# Patient Record
Sex: Male | Born: 1960 | Race: White | Hispanic: No | Marital: Married | Smoking: Current every day smoker
Health system: Southern US, Community
[De-identification: ages and names within clinical notes are randomized; demographics above are authoritative.]

## PROBLEM LIST (undated history)

## (undated) DIAGNOSIS — X838XXA Intentional self-harm by other specified means, initial encounter: Secondary | ICD-10-CM

## (undated) DIAGNOSIS — K219 Gastro-esophageal reflux disease without esophagitis: Secondary | ICD-10-CM

## (undated) DIAGNOSIS — E785 Hyperlipidemia, unspecified: Secondary | ICD-10-CM

## (undated) DIAGNOSIS — F329 Major depressive disorder, single episode, unspecified: Secondary | ICD-10-CM

## (undated) DIAGNOSIS — F32A Depression, unspecified: Secondary | ICD-10-CM

## (undated) HISTORY — DX: Hyperlipidemia, unspecified: E78.5

## (undated) HISTORY — PX: TONSILLECTOMY: SUR1361

## (undated) HISTORY — DX: Intentional self-harm by other specified means, initial encounter: X83.8XXA

## (undated) HISTORY — DX: Depression, unspecified: F32.A

## (undated) HISTORY — PX: LUNG SURGERY: SHX703

## (undated) HISTORY — DX: Major depressive disorder, single episode, unspecified: F32.9

## (undated) HISTORY — DX: Gastro-esophageal reflux disease without esophagitis: K21.9

---

## 1991-09-16 DIAGNOSIS — X838XXA Intentional self-harm by other specified means, initial encounter: Secondary | ICD-10-CM

## 1991-09-16 HISTORY — DX: Intentional self-harm by other specified means, initial encounter: X83.8XXA

## 2007-05-06 ENCOUNTER — Inpatient Hospital Stay (HOSPITAL_COMMUNITY): Admission: EM | Admit: 2007-05-06 | Discharge: 2007-05-12 | Payer: Self-pay | Admitting: Emergency Medicine
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
## 2011-01-16 ENCOUNTER — Ambulatory Visit (INDEPENDENT_AMBULATORY_CARE_PROVIDER_SITE_OTHER): Payer: Managed Care, Other (non HMO) | Admitting: Family Medicine

## 2011-01-16 ENCOUNTER — Encounter: Payer: Self-pay | Admitting: Family Medicine

## 2011-01-16 VITALS — BP 110/76 | Ht 70.0 in | Wt 162.0 lb

## 2011-01-16 DIAGNOSIS — F17200 Nicotine dependence, unspecified, uncomplicated: Secondary | ICD-10-CM

## 2011-01-16 DIAGNOSIS — Z13220 Encounter for screening for lipoid disorders: Secondary | ICD-10-CM

## 2011-01-16 DIAGNOSIS — N400 Enlarged prostate without lower urinary tract symptoms: Secondary | ICD-10-CM | POA: Insufficient documentation

## 2011-01-16 DIAGNOSIS — R05 Cough: Secondary | ICD-10-CM

## 2011-01-16 DIAGNOSIS — Z720 Tobacco use: Secondary | ICD-10-CM | POA: Insufficient documentation

## 2011-01-16 DIAGNOSIS — B351 Tinea unguium: Secondary | ICD-10-CM | POA: Insufficient documentation

## 2011-01-16 DIAGNOSIS — Z136 Encounter for screening for cardiovascular disorders: Secondary | ICD-10-CM

## 2011-01-16 DIAGNOSIS — Z0000 Encounter for general adult medical examination without abnormal findings: Secondary | ICD-10-CM

## 2011-01-16 LAB — POCT URINALYSIS DIPSTICK
Protein, UA: NEGATIVE
Spec Grav, UA: 1.015
pH, UA: 6

## 2011-01-16 LAB — HEPATIC FUNCTION PANEL
AST: 22 U/L (ref 0–37)
Albumin: 4.3 g/dL (ref 3.5–5.2)
Alkaline Phosphatase: 72 U/L (ref 39–117)
Bilirubin, Direct: 0.1 mg/dL (ref 0.0–0.3)
Total Protein: 6.7 g/dL (ref 6.0–8.3)

## 2011-01-16 LAB — CBC WITH DIFFERENTIAL/PLATELET
Basophils Relative: 0.5 % (ref 0.0–3.0)
Eosinophils Absolute: 0.4 10*3/uL (ref 0.0–0.7)
Eosinophils Relative: 5 % (ref 0.0–5.0)
Hemoglobin: 15.7 g/dL (ref 13.0–17.0)
Lymphocytes Relative: 33 % (ref 12.0–46.0)
MCHC: 35.2 g/dL (ref 30.0–36.0)
MCV: 95 fl (ref 78.0–100.0)
Monocytes Absolute: 0.7 10*3/uL (ref 0.1–1.0)
Neutro Abs: 3.7 10*3/uL (ref 1.4–7.7)
RBC: 4.71 Mil/uL (ref 4.22–5.81)
WBC: 7.3 10*3/uL (ref 4.5–10.5)

## 2011-01-16 LAB — BASIC METABOLIC PANEL
BUN: 13 mg/dL (ref 6–23)
CO2: 29 mEq/L (ref 19–32)
Chloride: 101 mEq/L (ref 96–112)
Creatinine, Ser: 1 mg/dL (ref 0.4–1.5)
Glucose, Bld: 91 mg/dL (ref 70–99)

## 2011-01-16 MED ORDER — VARENICLINE TARTRATE 0.5 MG X 11 & 1 MG X 42 PO MISC: ORAL | Status: AC

## 2011-01-16 NOTE — Assessment & Plan Note (Signed)
Check lft and pt interested in rx

## 2011-01-16 NOTE — Assessment & Plan Note (Signed)
Check PSA. ?

## 2011-01-16 NOTE — Patient Instructions (Signed)
Smoking Cessation This document explains the best ways for you to quit smoking and new treatments to help. It lists new medicines that can double or triple your chances of quitting and quitting for good. It also considers ways to avoid relapses and concerns you may have about quitting, including weight gain. NICOTINE: A POWERFUL ADDICTION If you have tried to quit smoking, you know how hard it can be. It is hard because nicotine is a very addictive drug. For some people, it can be as addictive as heroin or cocaine. Usually, people make 2 or 3 tries, or more, before finally being able to quit. Each time you try to quit, you can learn about what helps and what hurts. Quitting takes hard work and a lot of effort, but you can quit smoking. QUITTING SMOKING IS ONE OF THE MOST IMPORTANT THINGS YOU WILL EVER DO:  You will live longer, feel better, and live better.   The impact on your body of quitting smoking is felt almost immediately:   Within 20 minutes, blood pressure decreases. Pulse returns to its normal level.   After 8 hours, carbon monoxide levels in the blood return to normal. Oxygen level increases.   After 24 hours, chance of heart attack starts to decrease. Breath, hair, and body stop smelling like smoke.   After 48 hours, damaged nerve endings begin to recover. Sense of taste and smell improve.   After 72 hours, the body is virtually free of nicotine. Bronchial tubes relax and breathing becomes easier.   After 2 to 12 weeks, lungs can hold more air. Exercise becomes easier and circulation improves.   Quitting will lower your chance of having a heart attack, stroke, cancer, or lung disease:   After 1 year, the risk of coronary heart disease is cut in half.   After 5 years, the risk of stroke falls to the same as a nonsmoker.   After 10 years, the risk of lung cancer is cut in half and the risk of other cancers decreases significantly.   After 15 years, the risk of coronary heart  disease drops, usually to the level of a nonsmoker.   If you are pregnant, quitting smoking will improve your chances of having a healthy baby.   The people you live with, especially your children, will be healthier.   You will have extra money to spend on things other than cigarettes.  FIVE KEYS TO QUITTING Studies have shown that these 5 steps will help you quit smoking and quit for good. You have the best chances of quitting if you use them together: 1. Get ready.  2. Get support and encouragement.  3. Learn new skills and behaviors.  4. Get medicine to reduce your nicotine addiction and use it correctly.  5. Be prepared for relapse or difficult situations. Be determined to continue trying to quit, even if you do not succeed at first.  1. GET READY  Set a quit date.   Change your environment.   Get rid of ALL cigarettes, ashtrays, matches, and lighters in your home, car, and place of work.   Do not let people smoke in your home.   Review your past attempts to quit. Think about what worked and what did not.   Once you quit, do not smoke. NOT EVEN A PUFF!  2. GET SUPPORT AND ENCOURAGEMENT Studies have shown that you have a better chance of being successful if you have help. You can get support in many ways.  Tell   your family, friends, and coworkers that you are going to quit and need their support. Ask them not to smoke around you.   Talk to your caregivers (doctor, dentist, nurse, pharmacist, psychologist, and/or smoking counselor).   Get individual, group, or telephone counseling and support. The more counseling you have, the better your chances are of quitting. Programs are available at local hospitals and health centers. Call your local health department for information about programs in your area.   Spiritual beliefs and practices may help some smokers quit.   Quit meters are small computer programs online or downloadable that keep track of quit statistics, such as amount  of "quit-time," cigarettes not smoked, and money saved.   Many smokers find one or more of the many self-help books available useful in helping them quit and stay off tobacco.  3. LEARN NEW SKILLS AND BEHAVIORS  Try to distract yourself from urges to smoke. Talk to someone, go for a walk, or occupy your time with a task.   When you first try to quit, change your routine. Take a different route to work. Drink tea instead of coffee. Eat breakfast in a different place.   Do something to reduce your stress. Take a hot bath, exercise, or read a book.   Plan something enjoyable to do every day. Reward yourself for not smoking.   Explore interactive web-based programs that specialize in helping you quit.  4. GET MEDICINE AND USE IT CORRECTLY Medicines can help you stop smoking and decrease the urge to smoke. Combining medicine with the above behavioral methods and support can quadruple your chances of successfully quitting smoking. The [place] Food and Drug Administration (FDA) has approved 7 medicines to help you quit smoking. These medicines fall into 3 categories.  Nicotine replacement therapy (delivers nicotine to your body without the negative effects and risks of smoking):   Nicotine gum: Available over-the-counter.   Nicotine lozenges: Available over-the-counter.   Nicotine inhaler: Available by prescription.   Nicotine nasal spray: Available by prescription.   Nicotine skin patches (transdermal): Available by prescription and over-the-counter.   Antidepressant medicine (helps people abstain from smoking, but how this works is unknown):   Bupropion sustained-release (SR) tablets: Available by prescription.   Nicotinic receptor partial agonist (simulates the effect of nicotine in your brain):   Varenicline tartrate tablets: Available by prescription.   Ask your caregiver for advice about which medicines to use and how to use them. Carefully read the information on the package.    Everyone who is trying to quit may benefit from using a medicine. If you are pregnant or trying to become pregnant, nursing an infant, you are under age 18, or you smoke fewer than 10 cigarettes per day, talk to your caregiver before taking any nicotine replacement medicines.   You should stop using a nicotine replacement product and call your caregiver if you experience nausea, dizziness, weakness, vomiting, fast or irregular heartbeat, mouth problems with the lozenge or gum, or redness or swelling of the skin around the patch that does not go away.   Do not use any other product containing nicotine while using a nicotine replacement product.   Talk to your caregiver before using these products if you have diabetes, heart disease, asthma, stomach ulcers, you had a recent heart attack, you have high blood pressure that is not controlled with medicine, a history of irregular heartbeat, or you have been prescribed medicine to help you quit smoking.  5. BE PREPARED FOR RELAPSE OR   DIFFICULT SITUATIONS  Most relapses occur within the first 3 months after quitting. Do not be discouraged if you start smoking again. Remember, most people try several times before they finally quit.   You may have symptoms of withdrawal because your body is used to nicotine. You may crave cigarettes, be irritable, feel very hungry, cough often, get headaches, or have difficulty concentrating.   The withdrawal symptoms are only temporary. They are strongest when you first quit, but they will go away within 10 to 14 days.  Here are some difficult situations to watch for:  Alcohol. Avoid drinking alcohol. Drinking lowers your chances of successfully quitting.   Caffeine. Try to reduce the amount of caffeine you consume. It also lowers your chances of successfully quitting.   Other smokers. Being around smoking can make you want to smoke. Avoid smokers.   Weight gain. Many smokers will gain weight when they quit, usually  less than 10 pounds. Eat a healthy diet and stay active. Do not let weight gain distract you from your main goal, quitting smoking. Some medicines that help you quit smoking may also help delay weight gain. You can always lose the weight gained after you quit.   Bad mood or depression. There are a lot of ways to improve your mood other than smoking.  If you are having problems with any of these situations, talk to your caregiver. SPECIAL SITUATIONS OR CONDITIONS Studies suggest that everyone can quit smoking. Your situation or condition can give you a special reason to quit.  Pregnant women/New mothers: By quitting, you protect your baby's health and your own.   Hospitalized patients: By quitting, you reduce health problems and help healing.   Heart attack patients: By quitting, you reduce your risk of a second heart attack.   Lung, head, and neck cancer patients: By quitting, you reduce your chance of a second cancer.   Parents of children and adolescents: By quitting, you protect your children from illnesses caused by secondhand smoke.  QUESTIONS TO THINK ABOUT Think about the following questions before you try to stop smoking. You may want to talk about your answers with your caregiver.  Why do you want to quit?   If you tried to quit in the past, what helped and what did not?   What will be the most difficult situations for you after you quit? How will you plan to handle them?   Who can help you through the tough times? Your family? Friends? Caregiver?   What pleasures do you get from smoking? What ways can you still get pleasure if you quit?  Here are some questions to ask your caregiver:  How can you help me to be successful at quitting?   What medicine do you think would be best for me and how should I take it?   What should I do if I need more help?   What is smoking withdrawal like? How can I get information on withdrawal?  Quitting takes hard work and a lot of effort,  but you can quit smoking. FOR MORE INFORMATION Smokefree.gov (http://www.smokefree.gov) provides free, accurate, evidence-based information and professional assistance to help support the immediate and long-term needs of people trying to quit smoking. Document Released: 08/26/2001 Document Re-Released: 02/19/2010 [employer] Patient Information 2011 [employer]. 

## 2011-01-16 NOTE — Progress Notes (Signed)
  Subjective:    Patient ID: Randy [name], male    DOB: 30-Aug-1961, 50 y.o.   MRN: [id number]  HPI Pt here for cpe and labs.   Pt would like to quit smoking and is interested in chantix.  Pt also wants meds for fungus on his toenails.  He has used otc with no relief.    Review of Systems  Review of Systems  Constitutional: Negative for activity change, appetite change and fatigue.  HENT: Negative for hearing loss, congestion, tinnitus and ear discharge.  dentist --due Eyes: Negative for visual disturbance (see optho q1y -- vision corrected to 20/20 with glasses).  Respiratory: Negative for cough, chest tightness and shortness of breath.   Cardiovascular: Negative for chest pain, palpitations and leg swelling.  Gastrointestinal: Negative for abdominal pain, diarrhea, constipation and abdominal distention.  Genitourinary: Negative for urgency, frequency, decreased urine volume and difficulty urinating.  Musculoskeletal: Negative for back pain, arthralgias and gait problem.  Skin: Negative for color change, pallor and rash.  Neurological: Negative for dizziness, light-headedness, numbness and headaches.  Hematological: Negative for adenopathy. Does not bruise/bleed easily.  Psychiatric/Behavioral: Negative for suicidal ideas, confusion, sleep disturbance, self-injury, dysphoric mood, decreased concentration and agitation.      Objective:   Physical Exam BP 110/76  Ht 5\' 10"  (1.778 m)  Wt 162 lb (73.483 kg)  BMI 23.24 kg/m2  General Appearance:    Alert, cooperative, no distress, appears stated age  Head:    Normocephalic, without obvious abnormality, atraumatic  Eyes:    PERRL, conjunctiva/corneas clear, EOM's intact, fundi    benign, both eyes       Ears:    Normal TM's and external ear canals, both ears  Nose:   Nares normal, septum midline, mucosa normal, no drainage   or sinus tenderness  Throat:   Lips, mucosa, and tongue normal; teeth and gums normal  Neck:   Supple,  symmetrical, trachea midline, no adenopathy;       thyroid:  No enlargement/tenderness/nodules; no carotid   bruit or JVD  Back:     Symmetric, no curvature, ROM normal, no CVA tenderness  Lungs:     Few wheezes scattered, respirations unlabored  Chest wall:    No tenderness or deformity  Heart:    Regular rate and rhythm, S1 and S2 normal, no murmur, rub   or gallop  Abdomen:     Soft, non-tender, bowel sounds active all four quadrants,    no mass   Genitalia:    Normal male without lesion, discharge or tenderness  Rectal:    Normal tone, mildly enlarged prostate, no masses or tenderness;   guaiac negative stool  Extremities:   Extremities normal, atraumatic, no cyanosis or edema--+onchomycosis b/l big toes  Pulses:   2+ and symmetric all extremities  Skin:   Skin color, texture, turgor normal, no rashes or lesions  Lymph nodes:   Cervical, supraclavicular, and axillary nodes normal  Neurologic:   CNII-XII intact. Normal strength, sensation and reflexes      throughout        Assessment & Plan:

## 2011-01-16 NOTE — Assessment & Plan Note (Signed)
chantix

## 2011-01-16 NOTE — Assessment & Plan Note (Signed)
ghm utd Check fasting labs 

## 2011-01-17 LAB — LDL CHOLESTEROL, DIRECT: Direct LDL: 106.5 mg/dL

## 2011-01-21 ENCOUNTER — Encounter: Payer: Self-pay | Admitting: *Deleted

## 2011-01-21 ENCOUNTER — Telehealth: Payer: Self-pay | Admitting: *Deleted

## 2011-01-21 NOTE — Telephone Encounter (Addendum)
Left message to call office   Message copied by Candie Echevaria on Tue Jan 21, 2011  3:41 PM ------      Message from: Loreen Freud      Created: Mon Jan 20, 2011 11:25 PM       + TG are high-----   Cholesterol--- LDL goal < 100,  HDL >40,  TG < 150.  Diet and exercise will increase HDL and decrease LDL and TG.  Fish,  Fish Oil, Flaxseed oil will also help increase the HDL and decrease Triglycerides.   Take tricor 145 mg  #30 1 po qd, 2 refills.    Recheck labs in 3 months.  272.4  Lipid, hep

## 2011-01-22 NOTE — Telephone Encounter (Signed)
Left message to call office

## 2011-01-23 NOTE — Telephone Encounter (Signed)
Left message to call office

## 2011-01-28 MED ORDER — FENOFIBRATE 145 MG PO TABS: 145.0000 mg | ORAL_TABLET | Freq: Every day | ORAL | Status: DC
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
## 2011-01-28 NOTE — Telephone Encounter (Signed)
Pt aware, Rx sent to pharmacy, copy of labs mailed.
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
## 2011-03-17 ENCOUNTER — Telehealth: Payer: Self-pay | Admitting: *Deleted
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
## 2011-03-18 NOTE — Telephone Encounter (Signed)
Left message to call office

## 2011-03-18 NOTE — Telephone Encounter (Signed)
Agree--- can also try aveeno oatmeal bath. ----pt needs to be seen if rash no better

## 2011-03-18 NOTE — Telephone Encounter (Signed)
Pt  c/o itchy, reddish bumps/rash on legs and arms. Pt notes that he notice rash after a fishing trip he took which expose him to the sun for extensive hours. Pt indicated that he has not had any changes in food, laundry detergent, or household product that could contribute to rash. Pt also denies any exposure to any chemical or outside plant.  Pt advise to try benadryl for the rash and that it is not likely that tricor cause the rash since he has been on med for several months with no reaction. Pt ok, and advise if rash increases or worsen in any way he needs to be seen in office. Pt advise of warning sign to watch for.  Pt has tried a hydrocortisone cream which has relieved the itching but the rash is still present. Please advise

## 2011-03-18 NOTE — Telephone Encounter (Signed)
mssg left for patient to return call     KP

## 2011-03-21 NOTE — Telephone Encounter (Signed)
Pt notified and notes that rash is now going away.

## 2011-04-21 ENCOUNTER — Ambulatory Visit: Payer: Managed Care, Other (non HMO) | Admitting: Family Medicine

## 2011-04-25 ENCOUNTER — Encounter: Payer: Self-pay | Admitting: Family Medicine

## 2011-04-25 ENCOUNTER — Ambulatory Visit (INDEPENDENT_AMBULATORY_CARE_PROVIDER_SITE_OTHER): Payer: Managed Care, Other (non HMO) | Admitting: Family Medicine

## 2011-04-25 VITALS — BP 120/80 | HR 66 | Wt 160.0 lb

## 2011-04-25 DIAGNOSIS — E781 Pure hyperglyceridemia: Secondary | ICD-10-CM

## 2011-04-25 LAB — BASIC METABOLIC PANEL
CO2: 27 mEq/L (ref 19–32)
Calcium: 9.5 mg/dL (ref 8.4–10.5)
Chloride: 103 mEq/L (ref 96–112)
Glucose, Bld: 97 mg/dL (ref 70–99)
Sodium: 139 mEq/L (ref 135–145)

## 2011-04-25 LAB — LIPID PANEL
Cholesterol: 173 mg/dL (ref 0–200)
Triglycerides: 230 mg/dL — ABNORMAL HIGH (ref 0.0–149.0)

## 2011-04-25 MED ORDER — FENOFIBRATE MICRONIZED 130 MG PO CAPS: 130.0000 mg | ORAL_CAPSULE | Freq: Every day | ORAL | Status: DC

## 2011-04-25 NOTE — Progress Notes (Signed)
  Subjective:    Randy [name] is a 50 y.o. male here for follow up of dyslipidemia. The patient does not use medications that may worsen dyslipidemias (corticosteroids, progestins, anabolic steroids, diuretics, beta-blockers, amiodarone, cyclosporine, olanzapine). The patient exercises frequently. The patient is not known to have coexisting coronary artery disease.  Pt also thinks he may have had a reaction to tricor---but he was also in sun a lot---rash on hands b/l--itchy  Cardiac Risk Factors Age > 45-male, > 55-male:  YES  +1  Smoking:   NO  Sig. family hx of CHD*:  NO  Hypertension:   NO  Diabetes:   NO  HDL < 35:   YES  +1  HDL > 59:   NO  Total: 2   *- Sig. family h/o CHD per NCEP = MI or sudden death at <55yo in  father or other 1st-degree male relative, or <65yo in mother or  other 1st-degree male relative  The following portions of the patient's history were reviewed and updated as appropriate: allergies, current medications, past family history, past medical history, past social history, past surgical history and problem list.  Review of Systems Pertinent items are noted in HPI.    Objective:    BP 120/80  Pulse 66  Wt 160 lb (72.576 kg) General appearance: alert, cooperative, appears stated age and no distress Lungs: clear to auscultation bilaterally Heart: regular rate and rhythm, S1, S2 normal, no murmur, click, rub or gallop Skin: Skin color, texture, turgor normal. No rashes or lesions or papular - hand(s) bilateral  Lab Review Lab Results  Component Value Date   CHOL 189 01/16/2011   HDL 32.40* 01/16/2011   LDLDIRECT 106.5 01/16/2011      Assessment:    Dyslipidemia as detailed above with 2 CHD risk factors using NCEP scheme above.  Target levels for LDL are: < 100 mg/dl (CHD or "CHD risk equivalent" is present)  Explained to the patient the respective contributions of genetics, diet, and exercise to lipid levels and the use of medication in severe  cases which do not respond to lifestyle alteration. The patient's interest and motivation in making lifestyle changes seems fair.    Plan:    The following changes are planned for the next 3 months, at which time the patient will return for repeat fasting lipids:  1. Dietary changes: Increase soluble fiber Plant sterols 2grams per day (e.g. Benecol): yes Reduce saturated fat, "trans" monounsaturated fatty acids, and cholesterol 2. Exercise changes:  none 3. Other treatment: None 4. Lipid-lowering medications: antara  (Recommended by NCEP after 3-6 mos of dietary therapy & lifestyle modification,  except if CHD is present or LDL well above 190.) 5. Hormone replacement therapy (patient is a postmenopausal  woman): no 6. Screening for secondary causes of dyslipidemias: None indicated 7. Lipid screening for relatives: na 8. Follow up: 3 months. 9. Stop tricor for 2 weeks and see if rash resolves-- call office with results Note: The majority of the visit was spent in counseling on the pathophysiology and treatment of dyslipidemias. The total face-to-face time was in excess of 25 minutes.

## 2011-04-25 NOTE — Patient Instructions (Signed)
Stop tricor for 2 weeks---see if skin clears up Try antara 130 mg daily

## 2011-04-28 ENCOUNTER — Telehealth: Payer: Self-pay | Admitting: *Deleted

## 2011-04-28 NOTE — Telephone Encounter (Signed)
Cholesterol--- LDL goal < 100, HDL >40, TG < 150. Diet and exercise will increase HDL and decrease LDL and TG. Fish, Fish Oil, Flaxseed oil will also help increase the HDL and decrease Triglycerides. Recheck labs in 3 months. ----- how is pt doing with antara? ----lipid, hep 272.4   Left message to call back.

## 2011-05-02 NOTE — Telephone Encounter (Signed)
Left message to call office

## 2011-05-05 NOTE — Telephone Encounter (Signed)
Discussed with patient and he stated he had not started the Antara yet because he was going to the beach, he is back and and has been advised to start meds and if any issues to follow up with Dr.Lowne and he voiced understanding and agreed.       KP

## 2011-06-27 LAB — DIFFERENTIAL
Basophils Absolute: 0
Basophils Relative: 0
Eosinophils Absolute: 0
Eosinophils Relative: 1
Lymphocytes Relative: 21
Lymphs Abs: 2
Monocytes Absolute: 0.6
Monocytes Relative: 6
Neutro Abs: 6.9
Neutrophils Relative %: 72

## 2011-06-27 LAB — CBC
HCT: 40.6
HCT: 43.3
HCT: 46.5
Hemoglobin: 14.2
Hemoglobin: 15.3
Hemoglobin: 16.5
MCHC: 35.5
MCV: 94.1
Platelets: 248
Platelets: 275
RBC: 4.26
RBC: 4.94
RDW: 12.4
RDW: 12.5
RDW: 13
WBC: 11.7 — ABNORMAL HIGH
WBC: 9.6

## 2011-06-27 LAB — COMPREHENSIVE METABOLIC PANEL
Alkaline Phosphatase: 88
BUN: 8
CO2: 28
Chloride: 109
Glucose, Bld: 124 — ABNORMAL HIGH
Potassium: 3.9
Total Bilirubin: 0.5

## 2011-06-27 LAB — I-STAT 8, (EC8 V) (CONVERTED LAB)
BUN: 8
Bicarbonate: 27 — ABNORMAL HIGH
Chloride: 110
Glucose, Bld: 97
HCT: 49
Hemoglobin: 16.7
Operator id: 279831
Potassium: 4.2
Sodium: 144
TCO2: 29
pCO2, Ven: 52.6 — ABNORMAL HIGH
pH, Ven: 7.318 — ABNORMAL HIGH

## 2011-06-27 LAB — ETHANOL: Alcohol, Ethyl (B): 323 — ABNORMAL HIGH

## 2011-06-27 LAB — COMPREHENSIVE METABOLIC PANEL WITH GFR
ALT: 31
AST: 38 — ABNORMAL HIGH
Albumin: 4.4
Calcium: 8.4
Creatinine, Ser: 0.9
GFR calc Af Amer: >60
GFR calc non Af Amer: >60
Sodium: 144
Total Protein: 6.7

## 2011-06-27 LAB — PROTIME-INR
INR: 1
Prothrombin Time: 12.9

## 2011-06-27 LAB — APTT: aPTT: 33

## 2011-06-27 LAB — TYPE AND SCREEN
ABO/RH(D): O POS
Antibody Screen: NEGATIVE

## 2011-06-27 LAB — POCT I-STAT CREATININE
Creatinine, Ser: 0.9
Operator id: 279831

## 2011-06-27 LAB — ABO/RH: ABO/RH(D): O POS

## 2011-10-10 ENCOUNTER — Other Ambulatory Visit: Payer: Self-pay | Admitting: Family Medicine

## 2011-10-10 DIAGNOSIS — E781 Pure hyperglyceridemia: Secondary | ICD-10-CM

## 2011-10-10 MED ORDER — FENOFIBRATE MICRONIZED 130 MG PO CAPS: 130.0000 mg | ORAL_CAPSULE | Freq: Every day | ORAL | Status: DC

## 2011-10-10 NOTE — Telephone Encounter (Signed)
Letter mailed to schedule labs.      KP 

## 2011-10-20 ENCOUNTER — Other Ambulatory Visit: Payer: Self-pay

## 2011-10-20 DIAGNOSIS — E781 Pure hyperglyceridemia: Secondary | ICD-10-CM

## 2011-10-20 MED ORDER — FENOFIBRATE MICRONIZED 130 MG PO CAPS: 130.0000 mg | ORAL_CAPSULE | Freq: Every day | ORAL | Status: DC

## 2011-12-02 ENCOUNTER — Telehealth: Payer: Self-pay | Admitting: Family Medicine

## 2011-12-02 NOTE — Telephone Encounter (Signed)
Patient is coming in for labs 12/08/11 @ 110am, can you please put lab orders (got notice with refill in 10/2011)  in  Thanks

## 2011-12-02 NOTE — Telephone Encounter (Signed)
----  lipid, hep 272.4-- CPE is almost due, please schedule since apt are going fast     KP

## 2011-12-03 NOTE — Telephone Encounter (Signed)
Called patient back to advise approval of lab orders & that he is due for a Physical. Last Physical was 5.3.12 & our physicals go quick so please call me back to make an appointment for a CPE.

## 2011-12-08 ENCOUNTER — Other Ambulatory Visit (INDEPENDENT_AMBULATORY_CARE_PROVIDER_SITE_OTHER): Payer: Managed Care, Other (non HMO)

## 2011-12-08 ENCOUNTER — Other Ambulatory Visit: Payer: Self-pay

## 2011-12-08 DIAGNOSIS — E781 Pure hyperglyceridemia: Secondary | ICD-10-CM

## 2011-12-08 DIAGNOSIS — E7210 Disorders of sulfur-bearing amino-acid metabolism, unspecified: Secondary | ICD-10-CM

## 2011-12-08 LAB — LIPID PANEL
HDL: 35.9 mg/dL — ABNORMAL LOW (ref >39.00)
LDL Cholesterol: 80 mg/dL (ref 0–99)
Total CHOL/HDL Ratio: 4
Triglycerides: 192 mg/dL — ABNORMAL HIGH (ref 0.0–149.0)

## 2011-12-08 LAB — HEPATIC FUNCTION PANEL
Albumin: 4.4 g/dL (ref 3.5–5.2)
Total Bilirubin: 0.2 mg/dL — ABNORMAL LOW (ref 0.3–1.2)

## 2011-12-08 MED ORDER — ANTARA 130 MG PO CAPS: 130.0000 mg | ORAL_CAPSULE | Freq: Every day | ORAL | Status: DC

## 2012-02-02 ENCOUNTER — Ambulatory Visit (INDEPENDENT_AMBULATORY_CARE_PROVIDER_SITE_OTHER): Payer: Managed Care, Other (non HMO) | Admitting: Family Medicine

## 2012-02-02 ENCOUNTER — Encounter: Payer: Self-pay | Admitting: Family Medicine

## 2012-02-02 VITALS — BP 118/78 | HR 69 | Temp 98.3°F | Ht 70.0 in | Wt 164.0 lb

## 2012-02-02 DIAGNOSIS — E781 Pure hyperglyceridemia: Secondary | ICD-10-CM

## 2012-02-02 DIAGNOSIS — Z720 Tobacco use: Secondary | ICD-10-CM

## 2012-02-02 DIAGNOSIS — F17200 Nicotine dependence, unspecified, uncomplicated: Secondary | ICD-10-CM

## 2012-02-02 DIAGNOSIS — Z23 Encounter for immunization: Secondary | ICD-10-CM

## 2012-02-02 DIAGNOSIS — Z0000 Encounter for general adult medical examination without abnormal findings: Secondary | ICD-10-CM

## 2012-02-02 LAB — POCT URINALYSIS DIPSTICK
Blood, UA: NEGATIVE
Glucose, UA: NEGATIVE
Ketones, UA: NEGATIVE
Protein, UA: NEGATIVE
Spec Grav, UA: 1.01

## 2012-02-02 NOTE — Progress Notes (Signed)
Addended by: Arnette Norris on: 02/02/2012 02:23 PM   Modules accepted: Orders

## 2012-02-02 NOTE — Patient Instructions (Signed)
Preventive Care for Adults, Male A healthy lifestyle and preventative care can promote health and wellness. Preventative health guidelines for men include the following key practices:  A routine yearly physical is a good way to check with your caregiver about your health and preventative screening. It is a chance to share any concerns and updates on your health, and to receive a thorough exam.   Visit your dentist for a routine exam and preventative care every 6 months. Brush your teeth twice a day and floss once a day. Good oral hygiene prevents tooth decay and gum disease.   The frequency of eye exams is based on your age, health, family medical history, use of contact lenses, and other factors. Follow your caregiver's recommendations for frequency of eye exams.   Eat a healthy diet. Foods like vegetables, fruits, whole grains, low-fat dairy products, and lean protein foods contain the nutrients you need without too many calories. Decrease your intake of foods high in solid fats, added sugars, and salt. Eat the right amount of calories for you.Get information about a proper diet from your caregiver, if necessary.   Regular physical exercise is one of the most important things you can do for your health. Most adults should get at least 150 minutes of moderate-intensity exercise (any activity that increases your heart rate and causes you to sweat) each week. In addition, most adults need muscle-strengthening exercises on 2 or more days a week.   Maintain a healthy weight. The body mass index (BMI) is a screening tool to identify possible weight problems. It provides an estimate of body fat based on height and weight. Your caregiver can help determine your BMI, and can help you achieve or maintain a healthy weight.For adults 20 years and older:   A BMI below 18.5 is considered underweight.   A BMI of 18.5 to 24.9 is normal.   A BMI of 25 to 29.9 is considered overweight.   A BMI of 30 and above  is considered obese.   Maintain normal blood lipids and cholesterol levels by exercising and minimizing your intake of saturated fat. Eat a balanced diet with plenty of fruit and vegetables. Blood tests for lipids and cholesterol should begin at age 20 and be repeated every 5 years. If your lipid or cholesterol levels are high, you are over 50, or you are a high risk for heart disease, you may need your cholesterol levels checked more frequently.Ongoing high lipid and cholesterol levels should be treated with medicines if diet and exercise are not effective.   If you smoke, find out from your caregiver how to quit. If you do not use tobacco, do not start.   If you choose to drink alcohol, do not exceed 2 drinks per day. One drink is considered to be 12 ounces (355 mL) of beer, 5 ounces (148 mL) of wine, or 1.5 ounces (44 mL) of liquor.   Avoid use of street drugs. Do not share needles with anyone. Ask for help if you need support or instructions about stopping the use of drugs.   High blood pressure causes heart disease and increases the risk of stroke. Your blood pressure should be checked at least every 1 to 2 years. Ongoing high blood pressure should be treated with medicines, if weight loss and exercise are not effective.   If you are 45 to 50 years old, ask your caregiver if you should take aspirin to prevent heart disease.   Diabetes screening involves taking a blood   sample to check your fasting blood sugar level. This should be done once every 3 years, after age 45, if you are within normal weight and without risk factors for diabetes. Testing should be considered at a younger age or be carried out more frequently if you are overweight and have at least 1 risk factor for diabetes.   Colorectal cancer can be detected and often prevented. Most routine colorectal cancer screening begins at the age of 50 and continues through age 75. However, your caregiver may recommend screening at an earlier  age if you have risk factors for colon cancer. On a yearly basis, your caregiver may provide home test kits to check for hidden blood in the stool. Use of a small camera at the end of a tube, to directly examine the colon (sigmoidoscopy or colonoscopy), can detect the earliest forms of colorectal cancer. Talk to your caregiver about this at age 50, when routine screening begins. Direct examination of the colon should be repeated every 5 to 10 years through age 75, unless early forms of pre-cancerous polyps or small growths are found.   Hepatitis C blood testing is recommended for all people born from 1945 through 1965 and any individual with known risks for hepatitis C.   Practice safe sex. Use condoms and avoid high-risk sexual practices to reduce the spread of sexually transmitted infections (STIs). STIs include gonorrhea, chlamydia, syphilis, trichomonas, herpes, HPV, and human immunodeficiency virus (HIV). Herpes, HIV, and HPV are viral illnesses that have no cure. They can result in disability, cancer, and death.   A one-time screening for abdominal aortic aneurysm (AAA) and surgical repair of large AAAs by sound wave imaging (ultrasonography) is recommended for ages 65 to 75 years who are current or former smokers.   Healthy men should no longer receive prostate-specific antigen (PSA) blood tests as part of routine cancer screening. Consult with your caregiver about prostate cancer screening.   Testicular cancer screening is not recommended for adult males who have no symptoms. Screening includes self-exam, caregiver exam, and other screening tests. Consult with your caregiver about any symptoms you have or any concerns you have about testicular cancer.   Use sunscreen with skin protection factor (SPF) of 30 or more. Apply sunscreen liberally and repeatedly throughout the day. You should seek shade when your shadow is shorter than you. Protect yourself by wearing long sleeves, pants, a  wide-brimmed hat, and sunglasses year round, whenever you are outdoors.   Once a month, do a whole body skin exam, using a mirror to look at the skin on your back. Notify your caregiver of new moles, moles that have irregular borders, moles that are larger than a pencil eraser, or moles that have changed in shape or color.   Stay current with required immunizations.   Influenza. You need a dose every fall (or winter). The composition of the flu vaccine changes each year, so being vaccinated once is not enough.   Pneumococcal polysaccharide. You need 1 to 2 doses if you smoke cigarettes or if you have certain chronic medical conditions. You need 1 dose at age 65 (or older) if you have never been vaccinated.   Tetanus, diphtheria, pertussis (Tdap, Td). Get 1 dose of Tdap vaccine if you are younger than age 65 years, are over 65 and have contact with an infant, are a healthcare worker, or simply want to be protected from whooping cough. After that, you need a Td booster dose every 10 years. Consult your caregiver if   you have not had at least 3 tetanus and diphtheria-containing shots sometime in your life or have a deep or dirty wound.   HPV. This vaccine is recommended for males 13 through 50 years of age. This vaccine may be given to men 22 through 50 years of age who have not completed the 3 dose series. It is recommended for men through age 26 who have sex with men or whose immune system is weakened because of HIV infection, other illness, or medications. The vaccine is given in 3 doses over 6 months.   Measles, mumps, rubella (MMR). You need at least 1 dose of MMR if you were born in 1957 or later. You may also need a 2nd dose.   Meningococcal. If you are age 19 to 21 years and a first-year college student living in a residence hall, or have one of several medical conditions, you need to get vaccinated against meningococcal disease. You may also need additional booster doses.   Zoster (shingles).  If you are age 60 years or older, you should get this vaccine.   Varicella (chickenpox). If you have never had chickenpox or you were vaccinated but received only 1 dose, talk to your caregiver to find out if you need this vaccine.   Hepatitis A. You need this vaccine if you have a specific risk factor for hepatitis A virus infection, or you simply wish to be protected from this disease. The vaccine is usually given as 2 doses, 6 to 18 months apart.   Hepatitis B. You need this vaccine if you have a specific risk factor for hepatitis B virus infection or you simply wish to be protected from this disease. The vaccine is given in 3 doses, usually over 6 months.  Preventative Service / Frequency Ages 19 to 39  Blood pressure check.** / Every 1 to 2 years.   Lipid and cholesterol check.** / Every 5 years beginning at age 20.   Hepatitis C blood test.** / For any individual with known risks for hepatitis C.   Skin self-exam. / Monthly.   Influenza immunization.** / Every year.   Pneumococcal polysaccharide immunization.** / 1 to 2 doses if you smoke cigarettes or if you have certain chronic medical conditions.   Tetanus, diphtheria, pertussis (Tdap,Td) immunization. / A one-time dose of Tdap vaccine. After that, you need a Td booster dose every 10 years.   HPV immunization. / 3 doses over 6 months, if 26 and younger.   Measles, mumps, rubella (MMR) immunization. / You need at least 1 dose of MMR if you were born in 1957 or later. You may also need a 2nd dose.   Meningococcal immunization. / 1 dose if you are age 19 to 21 years and a first-year college student living in a residence hall, or have one of several medical conditions, you need to get vaccinated against meningococcal disease. You may also need additional booster doses.   Varicella immunization.** / Consult your caregiver.   Hepatitis A immunization.** / Consult your caregiver. 2 doses, 6 to 18 months apart.   Hepatitis B  immunization.** / Consult your caregiver. 3 doses usually over 6 months.  Ages 40 to 64  Blood pressure check.** / Every 1 to 2 years.   Lipid and cholesterol check.** / Every 5 years beginning at age 20.   Fecal occult blood test (FOBT) of stool. / Every year beginning at age 50 and continuing until age 75. You may not have to do this test if   you get colonoscopy every 10 years.   Flexible sigmoidoscopy** or colonoscopy.** / Every 5 years for a flexible sigmoidoscopy or every 10 years for a colonoscopy beginning at age 50 and continuing until age 75.   Hepatitis C blood test.** / For all people born from 1945 through 1965 and any individual with known risks for hepatitis C.   Skin self-exam. / Monthly.   Influenza immunization.** / Every year.   Pneumococcal polysaccharide immunization.** / 1 to 2 doses if you smoke cigarettes or if you have certain chronic medical conditions.   Tetanus, diphtheria, pertussis (Tdap/Td) immunization.** / A one-time dose of Tdap vaccine. After that, you need a Td booster dose every 10 years.   Measles, mumps, rubella (MMR) immunization. / You need at least 1 dose of MMR if you were born in 1957 or later. You may also need a 2nd dose.   Varicella immunization.**/ Consult your caregiver.   Meningococcal immunization.** / Consult your caregiver.   Hepatitis A immunization.** / Consult your caregiver. 2 doses, 6 to 18 months apart.   Hepatitis B immunization.** / Consult your caregiver. 3 doses, usually over 6 months.  Ages 65 and over  Blood pressure check.** / Every 1 to 2 years.   Lipid and cholesterol check.**/ Every 5 years beginning at age 20.   Fecal occult blood test (FOBT) of stool. / Every year beginning at age 50 and continuing until age 75. You may not have to do this test if you get colonoscopy every 10 years.   Flexible sigmoidoscopy** or colonoscopy.** / Every 5 years for a flexible sigmoidoscopy or every 10 years for a colonoscopy  beginning at age 50 and continuing until age 75.   Hepatitis C blood test.** / For all people born from 1945 through 1965 and any individual with known risks for hepatitis C.   Abdominal aortic aneurysm (AAA) screening.** / A one-time screening for ages 65 to 75 years who are current or former smokers.   Skin self-exam. / Monthly.   Influenza immunization.** / Every year.   Pneumococcal polysaccharide immunization.** / 1 dose at age 65 (or older) if you have never been vaccinated.   Tetanus, diphtheria, pertussis (Tdap, Td) immunization. / A one-time dose of Tdap vaccine if you are over 65 and have contact with an infant, are a healthcare worker, or simply want to be protected from whooping cough. After that, you need a Td booster dose every 10 years.   Varicella immunization. ** / Consult your caregiver.   Meningococcal immunization.** / Consult your caregiver.   Hepatitis A immunization. ** / Consult your caregiver. 2 doses, 6 to 18 months apart.   Hepatitis B immunization.** / Check with your caregiver. 3 doses, usually over 6 months.  **Family history and personal history of risk and conditions may change your caregiver's recommendations. Document Released: 10/28/2001 Document Revised: 08/21/2011 Document Reviewed: 01/27/2011 [employer] Patient Information 2012 [employer]. 
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
## 2012-02-02 NOTE — Assessment & Plan Note (Signed)
Nicotrol inhaler sample given to pt

## 2012-02-02 NOTE — Assessment & Plan Note (Signed)
Check labs  ghm utd   

## 2012-02-03 LAB — LIPID PANEL
Cholesterol: 182 mg/dL (ref 0–200)
HDL: 29.7 mg/dL — ABNORMAL LOW (ref >39.00)

## 2012-02-03 LAB — HEPATIC FUNCTION PANEL
Albumin: 4.4 g/dL (ref 3.5–5.2)
Alkaline Phosphatase: 58 U/L (ref 39–117)
Total Protein: 6.6 g/dL (ref 6.0–8.3)

## 2012-02-03 LAB — BASIC METABOLIC PANEL
CO2: 24 mEq/L (ref 19–32)
Chloride: 106 mEq/L (ref 96–112)
Glucose, Bld: 93 mg/dL (ref 70–99)
Potassium: 4.5 mEq/L (ref 3.5–5.1)
Sodium: 139 mEq/L (ref 135–145)

## 2012-02-03 LAB — CBC WITH DIFFERENTIAL/PLATELET
Basophils Absolute: 0 10*3/uL (ref 0.0–0.1)
Basophils Relative: 0.8 % (ref 0.0–3.0)
Eosinophils Absolute: 0.2 10*3/uL (ref 0.0–0.7)
HCT: 43.9 % (ref 39.0–52.0)
Hemoglobin: 14.7 g/dL (ref 13.0–17.0)
Lymphocytes Relative: 58.2 % — ABNORMAL HIGH (ref 12.0–46.0)
Lymphs Abs: 2.4 10*3/uL (ref 0.7–4.0)
MCHC: 33.5 g/dL (ref 30.0–36.0)
MCV: 92 fl (ref 78.0–100.0)
Neutro Abs: 1 10*3/uL — ABNORMAL LOW (ref 1.4–7.7)
RBC: 4.77 Mil/uL (ref 4.22–5.81)
RDW: 13 % (ref 11.5–14.6)

## 2012-02-03 LAB — TSH: TSH: 1.03 u[IU]/mL (ref 0.35–5.50)

## 2012-02-03 LAB — PSA: PSA: 0.3 ng/mL (ref 0.10–4.00)

## 2012-02-12 ENCOUNTER — Encounter: Payer: Self-pay | Admitting: *Deleted

## 2012-02-12 ENCOUNTER — Other Ambulatory Visit: Payer: Self-pay | Admitting: *Deleted

## 2012-02-12 MED ORDER — ATORVASTATIN CALCIUM 10 MG PO TABS: 10.0000 mg | ORAL_TABLET | Freq: Every day | ORAL | Status: DC

## 2012-03-23 ENCOUNTER — Other Ambulatory Visit: Payer: Self-pay | Admitting: Family Medicine

## 2012-03-23 DIAGNOSIS — E785 Hyperlipidemia, unspecified: Secondary | ICD-10-CM

## 2012-03-23 DIAGNOSIS — D7289 Other specified disorders of white blood cells: Secondary | ICD-10-CM

## 2012-03-23 NOTE — Telephone Encounter (Signed)
lab orders neededRecheck labs in 3 months. cbcd also abnormal---recheck in 3 months as well ----cbcd 288.8

## 2012-05-03 ENCOUNTER — Other Ambulatory Visit: Payer: Self-pay | Admitting: Family Medicine

## 2012-05-03 DIAGNOSIS — E781 Pure hyperglyceridemia: Secondary | ICD-10-CM

## 2012-05-03 MED ORDER — ANTARA 130 MG PO CAPS: ORAL_CAPSULE | ORAL | Status: DC

## 2012-05-03 NOTE — Telephone Encounter (Signed)
FENOFIBRATE 130 MG CAPSULE QTY:30 LAST REFILL: 03/23/12 TAKE ONE CAPSULE BY MOUTH EVERYDAY BEFORE BREAKFAST

## 2012-05-04 ENCOUNTER — Other Ambulatory Visit (INDEPENDENT_AMBULATORY_CARE_PROVIDER_SITE_OTHER): Payer: Managed Care, Other (non HMO)

## 2012-05-04 DIAGNOSIS — E785 Hyperlipidemia, unspecified: Secondary | ICD-10-CM

## 2012-05-04 DIAGNOSIS — D7289 Other specified disorders of white blood cells: Secondary | ICD-10-CM

## 2012-05-04 LAB — CBC WITH DIFFERENTIAL/PLATELET
Basophils Absolute: 0 10*3/uL (ref 0.0–0.1)
Lymphocytes Relative: 40.7 % (ref 12.0–46.0)
Monocytes Relative: 7.6 % (ref 3.0–12.0)
Neutrophils Relative %: 48 % (ref 43.0–77.0)
Platelets: 287 10*3/uL (ref 150.0–400.0)
RDW: 13.2 % (ref 11.5–14.6)

## 2012-05-04 LAB — LIPID PANEL
Cholesterol: 122 mg/dL (ref 0–200)
HDL: 38.6 mg/dL — ABNORMAL LOW (ref >39.00)
Triglycerides: 133 mg/dL (ref 0.0–149.0)

## 2012-05-04 LAB — HEPATIC FUNCTION PANEL
AST: 23 U/L (ref 0–37)
Albumin: 4.5 g/dL (ref 3.5–5.2)
Alkaline Phosphatase: 55 U/L (ref 39–117)
Total Protein: 6.9 g/dL (ref 6.0–8.3)

## 2012-05-13 ENCOUNTER — Other Ambulatory Visit: Payer: Self-pay | Admitting: Family Medicine

## 2012-05-13 DIAGNOSIS — E781 Pure hyperglyceridemia: Secondary | ICD-10-CM

## 2012-05-13 MED ORDER — ATORVASTATIN CALCIUM 10 MG PO TABS: 10.0000 mg | ORAL_TABLET | Freq: Every day | ORAL | Status: DC

## 2012-05-13 MED ORDER — ANTARA 130 MG PO CAPS: ORAL_CAPSULE | ORAL | Status: DC

## 2012-05-13 NOTE — Addendum Note (Signed)
Addended by: Arnette Norris on: 05/13/2012 11:29 AM   Modules accepted: Orders

## 2012-05-13 NOTE — Telephone Encounter (Signed)
Refills x 2 last ov 5.20.13 V70  1-Atorvastatin (Tab) 10 MG Take 1 tablet (10 mg total) by mouth daily.#30 wt/2-refills Last fill 8.10.13  2-ANTARA 130 MG 1 tab by mouth daily--repeat labs are due now NOTE labs drawn 8.2013 Last fill 8.19.13

## 2012-12-10 ENCOUNTER — Other Ambulatory Visit: Payer: Self-pay | Admitting: Family Medicine

## 2012-12-13 ENCOUNTER — Telehealth: Payer: Self-pay | Admitting: Family Medicine

## 2012-12-13 MED ORDER — ATORVASTATIN CALCIUM 10 MG PO TABS: 10.0000 mg | ORAL_TABLET | Freq: Every day | ORAL | Status: DC

## 2012-12-13 NOTE — Telephone Encounter (Signed)
Refill: atorvastatin 10 mg tablet. Take 1 tablet by mouth every day. Qty 30. Last fill 11-07-12

## 2013-01-18 ENCOUNTER — Other Ambulatory Visit: Payer: Self-pay | Admitting: Family Medicine

## 2013-02-14 ENCOUNTER — Ambulatory Visit (INDEPENDENT_AMBULATORY_CARE_PROVIDER_SITE_OTHER): Payer: BC Managed Care – PPO | Admitting: Family Medicine

## 2013-02-14 ENCOUNTER — Encounter: Payer: Self-pay | Admitting: Family Medicine

## 2013-02-14 VITALS — BP 112/68 | HR 70 | Temp 98.3°F | Ht 70.0 in | Wt 170.8 lb

## 2013-02-14 DIAGNOSIS — Z0000 Encounter for general adult medical examination without abnormal findings: Secondary | ICD-10-CM

## 2013-02-14 DIAGNOSIS — B351 Tinea unguium: Secondary | ICD-10-CM

## 2013-02-14 DIAGNOSIS — N400 Enlarged prostate without lower urinary tract symptoms: Secondary | ICD-10-CM

## 2013-02-14 DIAGNOSIS — Z1211 Encounter for screening for malignant neoplasm of colon: Secondary | ICD-10-CM

## 2013-02-14 DIAGNOSIS — E785 Hyperlipidemia, unspecified: Secondary | ICD-10-CM

## 2013-02-14 LAB — POC HEMOCCULT BLD/STL (OFFICE/1-CARD/DIAGNOSTIC): Fecal Occult Blood, POC: NEGATIVE

## 2013-02-14 MED ORDER — FENOFIBRATE MICRONIZED 130 MG PO CAPS: ORAL_CAPSULE | ORAL | Status: DC

## 2013-02-14 MED ORDER — ATORVASTATIN CALCIUM 10 MG PO TABS: ORAL_TABLET | ORAL | Status: DC

## 2013-02-14 MED ORDER — EPINEPHRINE 0.3 MG/0.3ML IJ SOAJ: 0.3000 mg | Freq: Once | INTRAMUSCULAR | Status: DC
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
## 2013-02-14 NOTE — Patient Instructions (Addendum)
Preventive Care for Adults, Male  A healthy lifestyle and preventive care can promote health and wellness. Preventive health guidelines for men include the following key practices:  · A routine yearly physical is a good way to check with your caregiver about your health and preventative screening. It is a chance to share any concerns and updates on your health, and to receive a thorough exam.  · Visit your dentist for a routine exam and preventative care every 6 months. Brush your teeth twice a day and floss once a day. Good oral hygiene prevents tooth decay and gum disease.  · The frequency of eye exams is based on your age, health, family medical history, use of contact lenses, and other factors. Follow your caregiver's recommendations for frequency of eye exams.  · Eat a healthy diet. Foods like vegetables, fruits, whole grains, low-fat dairy products, and lean protein foods contain the nutrients you need without too many calories. Decrease your intake of foods high in solid fats, added sugars, and salt. Eat the right amount of calories for you. Get information about a proper diet from your caregiver, if necessary.  · Regular physical exercise is one of the most important things you can do for your health. Most adults should get at least 150 minutes of moderate-intensity exercise (any activity that increases your heart rate and causes you to sweat) each week. In addition, most adults need muscle-strengthening exercises on 2 or more days a week.  · Maintain a healthy weight. The body mass index (BMI) is a screening tool to identify possible weight problems. It provides an estimate of body fat based on height and weight. Your caregiver can help determine your BMI, and can help you achieve or maintain a healthy weight. For adults 20 years and older:  · A BMI below 18.5 is considered underweight.  · A BMI of 18.5 to 24.9 is normal.  · A BMI of 25 to 29.9 is considered overweight.  · A BMI of 30 and above is  considered obese.  · Maintain normal blood lipids and cholesterol levels by exercising and minimizing your intake of saturated fat. Eat a balanced diet with plenty of fruit and vegetables. Blood tests for lipids and cholesterol should begin at age 20 and be repeated every 5 years. If your lipid or cholesterol levels are high, you are over 50, or you are a high risk for heart disease, you may need your cholesterol levels checked more frequently. Ongoing high lipid and cholesterol levels should be treated with medicines if diet and exercise are not effective.  · If you smoke, find out from your caregiver how to quit. If you do not use tobacco, do not start.  · If you choose to drink alcohol, do not exceed 2 drinks per day. One drink is considered to be 12 ounces (355 mL) of beer, 5 ounces (148 mL) of wine, or 1.5 ounces (44 mL) of liquor.  · Avoid use of street drugs. Do not share needles with anyone. Ask for help if you need support or instructions about stopping the use of drugs.  · High blood pressure causes heart disease and increases the risk of stroke. Your blood pressure should be checked at least every 1 to 2 years. Ongoing high blood pressure should be treated with medicines, if weight loss and exercise are not effective.  · If you are 45 to 51 years old, ask your caregiver if you should take aspirin to prevent heart disease.  · Diabetes screening involves taking   a blood sample to check your fasting blood sugar level. This should be done once every 3 years, after age 45, if you are within normal weight and without risk factors for diabetes. Testing should be considered at a younger age or be carried out more frequently if you are overweight and have at least 1 risk factor for diabetes.  · Colorectal cancer can be detected and often prevented. Most routine colorectal cancer screening begins at the age of 50 and continues through age 75. However, your caregiver may recommend screening at an earlier age if you  have risk factors for colon cancer. On a yearly basis, your caregiver may provide home test kits to check for hidden blood in the stool. Use of a small camera at the end of a tube, to directly examine the colon (sigmoidoscopy or colonoscopy), can detect the earliest forms of colorectal cancer. Talk to your caregiver about this at age 50, when routine screening begins.  Direct examination of the colon should be repeated every 5 to 10 years through age 75, unless early forms of pre-cancerous polyps or small growths are found.  · Hepatitis C blood testing is recommended for all people born from 1945 through 1965 and any individual with known risks for hepatitis C.  · Practice safe sex. Use condoms and avoid high-risk sexual practices to reduce the spread of sexually transmitted infections (STIs). STIs include gonorrhea, chlamydia, syphilis, trichomonas, herpes, HPV, and human immunodeficiency virus (HIV). Herpes, HIV, and HPV are viral illnesses that have no cure. They can result in disability, cancer, and death.  · A one-time screening for abdominal aortic aneurysm (AAA) and surgical repair of large AAAs by sound wave imaging (ultrasonography) is recommended for ages 65 to 75 years who are current or former smokers.  · Healthy men should no longer receive prostate-specific antigen (PSA) blood tests as part of routine cancer screening. Consult with your caregiver about prostate cancer screening.  · Testicular cancer screening is not recommended for adult males who have no symptoms. Screening includes self-exam, caregiver exam, and other screening tests. Consult with your caregiver about any symptoms you have or any concerns you have about testicular cancer.  · Use sunscreen with skin protection factor (SPF) of 30 or more. Apply sunscreen liberally and repeatedly throughout the day. You should seek shade when your shadow is shorter than you. Protect yourself by wearing long sleeves, pants, a wide-brimmed hat, and  sunglasses year round, whenever you are outdoors.  · Once a month, do a whole body skin exam, using a mirror to look at the skin on your back. Notify your caregiver of new moles, moles that have irregular borders, moles that are larger than a pencil eraser, or moles that have changed in shape or color.  · Stay current with required immunizations.  · Influenza. You need a dose every fall (or winter). The composition of the flu vaccine changes each year, so being vaccinated once is not enough.  · Pneumococcal polysaccharide. You need 1 to 2 doses if you smoke cigarettes or if you have certain chronic medical conditions. You need 1 dose at age 65 (or older) if you have never been vaccinated.  · Tetanus, diphtheria, pertussis (Tdap, Td). Get 1 dose of Tdap vaccine if you are younger than age 65 years, are over 65 and have contact with an infant, are a healthcare worker, or simply want to be protected from whooping cough. After that, you need a Td booster dose every 10 years. Consult your   caregiver if you have not had at least 3 tetanus and diphtheria-containing shots sometime in your life or have a deep or dirty wound.  · HPV. This vaccine is recommended for males 13 through 51 years of age. This vaccine may be given to men 22 through 51 years of age who have not completed the 3 dose series. It is recommended for men through age 26 who have sex with men or whose immune system is weakened because of HIV infection, other illness, or medications. The vaccine is given in 3 doses over 6 months.  · Measles, mumps, rubella (MMR). You need at least 1 dose of MMR if you were born in 1957 or later. You may also need a 2nd dose.  · Meningococcal. If you are age 19 to 21 years and a first-year college student living in a residence hall, or have one of several medical conditions, you need to get vaccinated against meningococcal disease. You may also need additional booster doses.  · Zoster (shingles). If you are age 60 years or  older, you should get this vaccine.  · Varicella (chickenpox). If you have never had chickenpox or you were vaccinated but received only 1 dose, talk to your caregiver to find out if you need this vaccine.  · Hepatitis A. You need this vaccine if you have a specific risk factor for hepatitis A virus infection, or you simply wish to be protected from this disease. The vaccine is usually given as 2 doses, 6 to 18 months apart.  · Hepatitis B. You need this vaccine if you have a specific risk factor for hepatitis B virus infection or you simply wish to be protected from this disease. The vaccine is given in 3 doses, usually over 6 months.  Preventative Service / Frequency  Ages 19 to 39  · Blood pressure check.** / Every 1 to 2 years.  · Lipid and cholesterol check.** / Every 5 years beginning at age 20.  · Hepatitis C blood test.** / For any individual with known risks for hepatitis C.  · Skin self-exam. / Monthly.  · Influenza immunization.** / Every year.  · Pneumococcal polysaccharide immunization.** / 1 to 2 doses if you smoke cigarettes or if you have certain chronic medical conditions.  · Tetanus, diphtheria, pertussis (Tdap,Td) immunization. / A one-time dose of Tdap vaccine. After that, you need a Td booster dose every 10 years.  · HPV immunization. / 3 doses over 6 months, if 26 and younger.  · Measles, mumps, rubella (MMR) immunization. / You need at least 1 dose of MMR if you were born in 1957 or later. You may also need a 2nd dose.  · Meningococcal immunization. / 1 dose if you are age 19 to 21 years and a first-year college student living in a residence hall, or have one of several medical conditions, you need to get vaccinated against meningococcal disease. You may also need additional booster doses.  · Varicella immunization.** / Consult your caregiver.  · Hepatitis A immunization.** / Consult your caregiver. 2 doses, 6 to 18 months apart.  · Hepatitis B immunization.** / Consult your caregiver. 3 doses  usually over 6 months.  Ages 40 to 64  · Blood pressure check.** / Every 1 to 2 years.  · Lipid and cholesterol check.** / Every 5 years beginning at age 20.  · Fecal occult blood test (FOBT) of stool. / Every year beginning at age 50 and continuing until age 75. You may not have   to do this test if you get colonoscopy every 10 years.  · Flexible sigmoidoscopy** or colonoscopy.** / Every 5 years for a flexible sigmoidoscopy or every 10 years for a colonoscopy beginning at age 50 and continuing until age 75.  · Hepatitis C blood test.** / For all people born from 1945 through 1965 and any individual with known risks for hepatitis C.  · Skin self-exam. / Monthly.  · Influenza immunization.** / Every year.  · Pneumococcal polysaccharide immunization.** / 1 to 2 doses if you smoke cigarettes or if you have certain chronic medical conditions.  · Tetanus, diphtheria, pertussis (Tdap/Td) immunization.** / A one-time dose of Tdap vaccine. After that, you need a Td booster dose every 10 years.  · Measles, mumps, rubella (MMR) immunization.  / You need at least 1 dose of MMR if you were born in 1957 or later. You may also need a 2nd dose.  · Varicella immunization.**/ Consult your caregiver.  · Meningococcal immunization.** / Consult your caregiver.  · Hepatitis A immunization.** / Consult your caregiver. 2 doses, 6 to 18 months apart.  · Hepatitis B immunization.** / Consult your caregiver. 3 doses, usually over 6 months.  Ages 65 and over  · Blood pressure check.** / Every 1 to 2 years.  · Lipid and cholesterol check.**/ Every 5 years beginning at age 20.  · Fecal occult blood test (FOBT) of stool. / Every year beginning at age 50 and continuing until age 75. You may not have to do this test if you get colonoscopy every 10 years.  · Flexible sigmoidoscopy** or colonoscopy.** / Every 5 years for a flexible sigmoidoscopy or every 10 years for a colonoscopy beginning at age 50 and continuing until age 75.  · Hepatitis C blood  test.** / For all people born from 1945 through 1965 and any individual with known risks for hepatitis C.  · Abdominal aortic aneurysm (AAA) screening.** / A one-time screening for ages 65 to 75 years who are current or former smokers.  · Skin self-exam. / Monthly.  · Influenza immunization.** / Every year.  · Pneumococcal polysaccharide immunization.** / 1 dose at age 65 (or older) if you have never been vaccinated.  · Tetanus, diphtheria, pertussis (Tdap, Td) immunization. / A one-time dose of Tdap vaccine if you are over 65 and have contact with an infant, are a healthcare worker, or simply want to be protected from whooping cough. After that, you need a Td booster dose every 10 years.  · Varicella immunization. ** / Consult your caregiver.  · Meningococcal immunization.** / Consult your caregiver.  · Hepatitis A immunization. ** / Consult your caregiver. 2 doses, 6 to 18 months apart.  · Hepatitis B immunization.** / Check with your caregiver. 3 doses, usually over 6 months.  **Family history and personal history of risk and conditions may change your caregiver's recommendations.  Document Released: 10/28/2001 Document Revised: 11/24/2011 Document Reviewed: 01/27/2011  [employer]® Patient Information ©2014 [employer].

## 2013-02-15 LAB — CBC WITH DIFFERENTIAL/PLATELET
Basophils Relative: 0.7 % (ref 0.0–3.0)
Eosinophils Absolute: 0.2 10*3/uL (ref 0.0–0.7)
Hemoglobin: 14.6 g/dL (ref 13.0–17.0)
Lymphocytes Relative: 32.2 % (ref 12.0–46.0)
MCHC: 34.7 g/dL (ref 30.0–36.0)
MCV: 92.3 fl (ref 78.0–100.0)
Neutro Abs: 3.7 10*3/uL (ref 1.4–7.7)
RBC: 4.56 Mil/uL (ref 4.22–5.81)

## 2013-02-15 LAB — POCT URINALYSIS DIPSTICK
Bilirubin, UA: NEGATIVE
Glucose, UA: NEGATIVE
Ketones, UA: NEGATIVE
Leukocytes, UA: NEGATIVE
Protein, UA: NEGATIVE
Spec Grav, UA: 1.01

## 2013-02-15 LAB — HEPATIC FUNCTION PANEL
AST: 27 U/L (ref 0–37)
Albumin: 4.3 g/dL (ref 3.5–5.2)
Alkaline Phosphatase: 50 U/L (ref 39–117)
Total Protein: 6.6 g/dL (ref 6.0–8.3)

## 2013-02-15 LAB — BASIC METABOLIC PANEL
CO2: 25 mEq/L (ref 19–32)
Calcium: 9.4 mg/dL (ref 8.4–10.5)
Chloride: 105 mEq/L (ref 96–112)
Potassium: 4.1 mEq/L (ref 3.5–5.1)
Sodium: 139 mEq/L (ref 135–145)

## 2013-02-15 LAB — TSH: TSH: 1.07 u[IU]/mL (ref 0.35–5.50)

## 2013-02-15 LAB — PSA: PSA: 0.24 ng/mL (ref 0.10–4.00)

## 2013-02-15 LAB — LIPID PANEL: HDL: 30.7 mg/dL — ABNORMAL LOW (ref >39.00)

## 2013-02-15 LAB — LDL CHOLESTEROL, DIRECT: Direct LDL: 81 mg/dL

## 2013-02-16 DIAGNOSIS — E785 Hyperlipidemia, unspecified: Secondary | ICD-10-CM | POA: Insufficient documentation

## 2013-02-16 NOTE — Assessment & Plan Note (Signed)
Check labs 

## 2013-02-16 NOTE — Assessment & Plan Note (Signed)
See meds and orders 

## 2013-02-19 ENCOUNTER — Other Ambulatory Visit: Payer: Self-pay | Admitting: Family Medicine

## 2013-02-21 ENCOUNTER — Encounter (HOSPITAL_BASED_OUTPATIENT_CLINIC_OR_DEPARTMENT_OTHER): Payer: Self-pay | Admitting: *Deleted

## 2013-02-21 ENCOUNTER — Emergency Department (HOSPITAL_BASED_OUTPATIENT_CLINIC_OR_DEPARTMENT_OTHER)
Admission: EM | Admit: 2013-02-21 | Discharge: 2013-02-21 | Disposition: A | Discharge status: Home / Self Care | Payer: Worker's Compensation | Attending: Emergency Medicine | Admitting: Emergency Medicine

## 2013-02-21 ENCOUNTER — Emergency Department (HOSPITAL_BASED_OUTPATIENT_CLINIC_OR_DEPARTMENT_OTHER): Payer: Worker's Compensation

## 2013-02-21 DIAGNOSIS — Z87828 Personal history of other (healed) physical injury and trauma: Secondary | ICD-10-CM | POA: Insufficient documentation

## 2013-02-21 DIAGNOSIS — S61209A Unspecified open wound of unspecified finger without damage to nail, initial encounter: Secondary | ICD-10-CM | POA: Insufficient documentation

## 2013-02-21 DIAGNOSIS — W278XXA Contact with other nonpowered hand tool, initial encounter: Secondary | ICD-10-CM | POA: Insufficient documentation

## 2013-02-21 DIAGNOSIS — E785 Hyperlipidemia, unspecified: Secondary | ICD-10-CM | POA: Insufficient documentation

## 2013-02-21 DIAGNOSIS — Z8719 Personal history of other diseases of the digestive system: Secondary | ICD-10-CM | POA: Insufficient documentation

## 2013-02-21 DIAGNOSIS — Z79899 Other long term (current) drug therapy: Secondary | ICD-10-CM | POA: Insufficient documentation

## 2013-02-21 DIAGNOSIS — Y999 Unspecified external cause status: Secondary | ICD-10-CM | POA: Insufficient documentation

## 2013-02-21 DIAGNOSIS — Z8659 Personal history of other mental and behavioral disorders: Secondary | ICD-10-CM | POA: Insufficient documentation

## 2013-02-21 DIAGNOSIS — Y939 Activity, unspecified: Secondary | ICD-10-CM | POA: Insufficient documentation

## 2013-02-21 DIAGNOSIS — S61219A Laceration without foreign body of unspecified finger without damage to nail, initial encounter: Secondary | ICD-10-CM

## 2013-02-21 DIAGNOSIS — F17200 Nicotine dependence, unspecified, uncomplicated: Secondary | ICD-10-CM | POA: Insufficient documentation

## 2013-02-21 DIAGNOSIS — Y9289 Other specified places as the place of occurrence of the external cause: Secondary | ICD-10-CM | POA: Insufficient documentation

## 2013-02-21 MED ORDER — HYDROCODONE-ACETAMINOPHEN 5-325 MG PO TABS: 2.0000 | ORAL_TABLET | ORAL | Status: DC | PRN

## 2013-02-21 MED ORDER — LIDOCAINE HCL (PF) 1 % IJ SOLN
INTRAMUSCULAR | Status: AC
  Filled 2013-02-21: qty 5

## 2013-02-21 NOTE — ED Provider Notes (Signed)
Medical screening examination/treatment/procedure(s) were performed by non-physician practitioner and as supervising physician I was immediately available for consultation/collaboration.  Hurman Horn, MD 02/21/13 2116
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
## 2013-02-21 NOTE — ED Notes (Signed)
Karen, PA-C at bedside.  

## 2013-02-21 NOTE — ED Notes (Signed)
Laceration to his left 4th digit while at work. Bleeding controlled with pressure.  Workmans comp.

## 2013-02-25 ENCOUNTER — Telehealth: Payer: Self-pay | Admitting: Hematology & Oncology

## 2013-02-25 ENCOUNTER — Encounter: Payer: Self-pay | Admitting: Family Medicine

## 2013-02-25 NOTE — Telephone Encounter (Signed)
Recvd fax from [employer] requesting medicals records for a workers comp claim. Dr. Myna Hidalgo has no medical records presently on file for this patient. Form faxed by saying no clinicals on file at this time.   CLAIM# [id number] [employer] [address] [place], [place] [address] Ph: [phone] Fx: [phone]

## 2013-03-04 ENCOUNTER — Telehealth: Payer: Self-pay | Admitting: Family Medicine

## 2013-03-04 NOTE — Telephone Encounter (Signed)
noted 

## 2013-03-04 NOTE — Telephone Encounter (Signed)
In reference to Gastroenterology referral entered 6/02/14/13, patient has not been scheduled.  [facility] attempted to reach patient, left messages for return call, as did I.  I also mailed patient a letter.  As of today, 03/04/13, no response.

## 2013-05-24 ENCOUNTER — Other Ambulatory Visit (INDEPENDENT_AMBULATORY_CARE_PROVIDER_SITE_OTHER): Payer: BC Managed Care – PPO

## 2013-05-24 DIAGNOSIS — E785 Hyperlipidemia, unspecified: Secondary | ICD-10-CM

## 2013-05-24 LAB — LIPID PANEL
Cholesterol: 167 mg/dL (ref 0–200)
HDL: 31.9 mg/dL — ABNORMAL LOW (ref >39.00)
Triglycerides: 440 mg/dL — ABNORMAL HIGH (ref 0.0–149.0)

## 2013-05-24 LAB — HEPATIC FUNCTION PANEL
ALT: 27 U/L (ref 0–53)
AST: 28 U/L (ref 0–37)
Total Protein: 6.9 g/dL (ref 6.0–8.3)

## 2013-05-24 LAB — LDL CHOLESTEROL, DIRECT: Direct LDL: 81.6 mg/dL

## 2013-05-30 ENCOUNTER — Other Ambulatory Visit: Payer: Self-pay

## 2013-05-30 MED ORDER — ATORVASTATIN CALCIUM 20 MG PO TABS: ORAL_TABLET | ORAL | Status: DC

## 2013-07-21 ENCOUNTER — Other Ambulatory Visit: Payer: Self-pay

## 2013-09-16 ENCOUNTER — Other Ambulatory Visit: Payer: Self-pay | Admitting: Family Medicine

## 2013-10-24 ENCOUNTER — Other Ambulatory Visit: Payer: Self-pay | Admitting: Family Medicine

## 2013-11-15 ENCOUNTER — Other Ambulatory Visit: Payer: Self-pay | Admitting: Family Medicine

## 2013-11-15 DIAGNOSIS — Z800 Family history of malignant neoplasm of digestive organs: Secondary | ICD-10-CM

## 2013-11-15 DIAGNOSIS — Z0000 Encounter for general adult medical examination without abnormal findings: Secondary | ICD-10-CM

## 2013-11-24 ENCOUNTER — Other Ambulatory Visit: Payer: Self-pay | Admitting: Family Medicine

## 2013-12-05 ENCOUNTER — Encounter: Payer: Self-pay | Admitting: Gastroenterology

## 2013-12-23 ENCOUNTER — Other Ambulatory Visit: Payer: Self-pay | Admitting: Family Medicine

## 2013-12-30 ENCOUNTER — Other Ambulatory Visit: Payer: Self-pay | Admitting: Family Medicine

## 2013-12-30 NOTE — Telephone Encounter (Signed)
Atorvastatin refilled. Called and left message for patient to please call and schedule OV. JG//CMA

## 2014-01-20 ENCOUNTER — Ambulatory Visit (AMBULATORY_SURGERY_CENTER): Payer: Self-pay

## 2014-01-20 VITALS — Ht 69.0 in | Wt 171.0 lb

## 2014-01-20 DIAGNOSIS — Z800 Family history of malignant neoplasm of digestive organs: Secondary | ICD-10-CM

## 2014-01-20 MED ORDER — SUPREP BOWEL PREP KIT 17.5-3.13-1.6 GM/177ML PO SOLN: 1.0000 | Freq: Once | ORAL | Status: DC

## 2014-01-20 NOTE — Progress Notes (Signed)
No allergies to eggs or soy No home oxygen No past problems with anesthesia No diet/weight loss meds  Has email.  Emmi instructions given for colonoscopy. 

## 2014-01-24 ENCOUNTER — Encounter: Payer: Self-pay | Admitting: Gastroenterology

## 2014-02-02 ENCOUNTER — Other Ambulatory Visit: Payer: Self-pay | Admitting: Family Medicine

## 2014-02-03 ENCOUNTER — Encounter: Payer: Self-pay | Admitting: Gastroenterology

## 2014-02-03 ENCOUNTER — Ambulatory Visit (AMBULATORY_SURGERY_CENTER): Payer: BC Managed Care – PPO | Admitting: Gastroenterology

## 2014-02-03 VITALS — BP 128/79 | HR 63 | Temp 96.4°F | Resp 19 | Ht 69.0 in | Wt 171.0 lb

## 2014-02-03 DIAGNOSIS — D126 Benign neoplasm of colon, unspecified: Secondary | ICD-10-CM

## 2014-02-03 DIAGNOSIS — Z800 Family history of malignant neoplasm of digestive organs: Secondary | ICD-10-CM

## 2014-02-03 DIAGNOSIS — Z1211 Encounter for screening for malignant neoplasm of colon: Secondary | ICD-10-CM

## 2014-02-03 MED ORDER — SODIUM CHLORIDE 0.9 % IV SOLN: 500.0000 mL | INTRAVENOUS | Status: DC

## 2014-02-03 NOTE — Progress Notes (Signed)
Called to room to assist during endoscopic procedure.  Patient ID and intended procedure confirmed with present staff. Received instructions for my participation in the procedure from the performing physician.  

## 2014-02-03 NOTE — Progress Notes (Signed)
A/ox3 pleased with MAC, report to Tracy RN 

## 2014-02-03 NOTE — Op Note (Signed)
[facility]  [address] [place], [id number]   COLONOSCOPY PROCEDURE REPORT  PATIENT: Randy [name]  MR#: [id number] BIRTHDATE: 07-23-1961 , 52  yrs. old GENDER: Male ENDOSCOPIST: Inda Castle, MD REFERRED PN:TIRWER Edon, DO PROCEDURE DATE:  02/03/2014 PROCEDURE:   Colonoscopy with snare polypectomy, Colonoscopy with cold biopsy polypectomy, and Submucosal injection, any substance First Screening Colonoscopy - Avg.  risk and is 50 yrs.  old or older Yes.  Prior Negative Screening - Now for repeat screening. N/A  History of Adenoma - Now for follow-up colonoscopy & has been > or = to 3 yrs.  N/A  Polyps Removed Today? Yes. ASA CLASS:   Class II INDICATIONS:Patient's immediate family history of colon cancer and elevated risk screening. MEDICATIONS: MAC sedation, administered by CRNA and propofol (Diprivan) 450mg  IV  DESCRIPTION OF PROCEDURE:   After the risks benefits and alternatives of the procedure were thoroughly explained, informed consent was obtained.  A digital rectal exam revealed no abnormalities of the rectum.   The [id number]  endoscope was introduced through the anus and advanced to the cecum, which was identified by both the appendix and ileocecal valve. No adverse events experienced.   The quality of the prep was excellent using Suprep  The instrument was then slowly withdrawn as the colon was fully examined.      COLON FINDINGS: A sessile polyp measuring 25 mm in size was found in the ascending colon.  Endoscopic mucosal resection was performed in a piecemeal fashion by injecting 7cc saline into the submucosa to raise the lesion and polypectomy was performed with snare cautery. The wound at the site was closed by placing hemoclips.  Three (3) placements were made to decrease the risk for bleeding.  There was no blood loss from maneuver.  Three (3) placements were made. There was no blood loss from maneuver.  A tattoo was  applied.   A sessile polyp measuring 2 mm in size was found in the transverse colon.  A polypectomy was performed with cold forceps.   A sessile polyp measuring 3 mm in size was found in the descending colon.  A polypectomy was performed.  The resection was complete and the polyp tissue was completely retrieved.   Internal hemorrhoids were found.  Retroflexed views revealed no abnormalities. The time to cecum=5 minutes 36 seconds.  Withdrawal time=29 minutes 10 seconds. The scope was withdrawn and the procedure completed. COMPLICATIONS: There were no complications.  ENDOSCOPIC IMPRESSION: 1.   Sessile polyp measuring 25 mm in size was found in the ascending colon; endoscopic mucosal resection was performed; the wound at the site was closed by placing hemoclips; saline was given to lift the mucosal wall.; a tattoo was applied 2.   Sessile polyp measuring 2 mm in size was found in the transverse colon; polypectomy was performed with cold forceps 3.   Sessile polyp measuring 3 mm in size was found in the descending colon; polypectomy was performed 4.   Internal hemorrhoids  RECOMMENDATIONS: Colonoscopy 1 year with ERBE   eSigned:  Inda Castle, MD 02/03/2014 12:28 PM   cc:   PATIENT NAME:  Randy [name], Randy [name] MR#: [id number]

## 2014-02-03 NOTE — Patient Instructions (Signed)
Impressions/recommendations:  Polyps (handout given) Internal hemorrhoids (handout given)  Repeat colonoscopy 1 year  YOU HAD AN ENDOSCOPIC PROCEDURE TODAY AT [facility]: Refer to the procedure report that was given to you for any specific questions about what was found during the examination.  If the procedure report does not answer your questions, please call your gastroenterologist to clarify.  If you requested that your care partner not be given the details of your procedure findings, then the procedure report has been included in a sealed envelope for you to review at your convenience later.  YOU SHOULD EXPECT: Some feelings of bloating in the abdomen. Passage of more gas than usual.  Walking can help get rid of the air that was put into your GI tract during the procedure and reduce the bloating. If you had a lower endoscopy (such as a colonoscopy or flexible sigmoidoscopy) you may notice spotting of blood in your stool or on the toilet paper. If you underwent a bowel prep for your procedure, then you may not have a normal bowel movement for a few days.  DIET: Your first meal following the procedure should be a light meal and then it is ok to progress to your normal diet.  A half-sandwich or bowl of soup is an example of a good first meal.  Heavy or fried foods are harder to digest and may make you feel nauseous or bloated.  Likewise meals heavy in dairy and vegetables can cause extra gas to form and this can also increase the bloating.  Drink plenty of fluids but you should avoid alcoholic beverages for 24 hours.  ACTIVITY: Your care partner should take you home directly after the procedure.  You should plan to take it easy, moving slowly for the rest of the day.  You can resume normal activity the day after the procedure however you should NOT DRIVE or use heavy machinery for 24 hours (because of the sedation medicines used during the test).    SYMPTOMS TO REPORT  IMMEDIATELY: A gastroenterologist can be reached at any hour.  During normal business hours, 8:30 AM to 5:00 PM Monday through Friday, call [phone].  After hours and on weekends, please call the GI answering service at [phone] who will take a message and have the physician on call contact you.   Following lower endoscopy (colonoscopy or flexible sigmoidoscopy):  Excessive amounts of blood in the stool  Significant tenderness or worsening of abdominal pains  Swelling of the abdomen that is new, acute  Fever of 100F or higher   FOLLOW UP: If any biopsies were taken you will be contacted by phone or by letter within the next 1-3 weeks.  Call your gastroenterologist if you have not heard about the biopsies in 3 weeks.  Our staff will call the home number listed on your records the next business day following your procedure to check on you and address any questions or concerns that you may have at that time regarding the information given to you following your procedure. This is a courtesy call and so if there is no answer at the home number and we have not heard from you through the emergency physician on call, we will assume that you have returned to your regular daily activities without incident.  SIGNATURES/CONFIDENTIALITY: You and/or your care partner have signed paperwork which will be entered into your electronic medical record.  These signatures attest to the fact that that the information above on your After  Visit Summary has been reviewed and is understood.  Full responsibility of the confidentiality of this discharge information lies with you and/or your care-partner. 

## 2014-02-07 ENCOUNTER — Telehealth: Payer: Self-pay | Admitting: *Deleted

## 2014-02-07 NOTE — Telephone Encounter (Signed)
  Follow up Call-  Call back number 02/03/2014  Post procedure Call Back phone  # [phone]  Permission to leave phone message Yes     Patient questions:  Message left to call us if necessary.

## 2014-02-10 ENCOUNTER — Encounter: Payer: Self-pay | Admitting: Gastroenterology

## 2014-03-17 ENCOUNTER — Other Ambulatory Visit: Payer: Self-pay | Admitting: Family Medicine

## 2014-04-11 ENCOUNTER — Other Ambulatory Visit: Payer: Self-pay | Admitting: Family Medicine

## 2014-06-06 ENCOUNTER — Encounter: Payer: Self-pay | Admitting: Family Medicine

## 2014-06-09 ENCOUNTER — Encounter: Payer: Self-pay | Admitting: Family Medicine

## 2014-06-09 ENCOUNTER — Ambulatory Visit (INDEPENDENT_AMBULATORY_CARE_PROVIDER_SITE_OTHER): Payer: BC Managed Care – PPO | Admitting: Family Medicine

## 2014-06-09 VITALS — BP 138/84 | HR 58 | Temp 97.8°F | Ht 69.25 in | Wt 167.5 lb

## 2014-06-09 DIAGNOSIS — F17209 Nicotine dependence, unspecified, with unspecified nicotine-induced disorders: Secondary | ICD-10-CM

## 2014-06-09 DIAGNOSIS — E785 Hyperlipidemia, unspecified: Secondary | ICD-10-CM

## 2014-06-09 DIAGNOSIS — Z0000 Encounter for general adult medical examination without abnormal findings: Secondary | ICD-10-CM

## 2014-06-09 DIAGNOSIS — R748 Abnormal levels of other serum enzymes: Secondary | ICD-10-CM

## 2014-06-09 DIAGNOSIS — F17200 Nicotine dependence, unspecified, uncomplicated: Secondary | ICD-10-CM

## 2014-06-09 LAB — POCT URINALYSIS DIPSTICK
BILIRUBIN UA: NEGATIVE
GLUCOSE UA: NEGATIVE
KETONES UA: NEGATIVE
Leukocytes, UA: NEGATIVE
Nitrite, UA: NEGATIVE
Protein, UA: NEGATIVE
RBC UA: NEGATIVE
Urobilinogen, UA: 2
pH, UA: 6

## 2014-06-09 LAB — LIPID PANEL
CHOLESTEROL: 147 mg/dL (ref 0–200)
HDL: 35.5 mg/dL — ABNORMAL LOW (ref >39.00)
NonHDL: 111.5
TRIGLYCERIDES: 201 mg/dL — AB (ref 0.0–149.0)
Total CHOL/HDL Ratio: 4
VLDL: 40.2 mg/dL — ABNORMAL HIGH (ref 0.0–40.0)

## 2014-06-09 LAB — LDL CHOLESTEROL, DIRECT: Direct LDL: 85.7 mg/dL

## 2014-06-09 LAB — BASIC METABOLIC PANEL
BUN: 19 mg/dL (ref 6–23)
CHLORIDE: 103 meq/L (ref 96–112)
CO2: 23 meq/L (ref 19–32)
Calcium: 9.2 mg/dL (ref 8.4–10.5)
Creatinine, Ser: 1.1 mg/dL (ref 0.4–1.5)
GFR: 78.85 mL/min (ref >60.00)
GLUCOSE: 91 mg/dL (ref 70–99)
POTASSIUM: 3.6 meq/L (ref 3.5–5.1)
Sodium: 134 mEq/L — ABNORMAL LOW (ref 135–145)

## 2014-06-09 LAB — CBC WITH DIFFERENTIAL/PLATELET
BASOS PCT: 0.6 % (ref 0.0–3.0)
Basophils Absolute: 0 10*3/uL (ref 0.0–0.1)
EOS PCT: 2.5 % (ref 0.0–5.0)
Eosinophils Absolute: 0.2 10*3/uL (ref 0.0–0.7)
HEMATOCRIT: 43 % (ref 39.0–52.0)
Hemoglobin: 14.8 g/dL (ref 13.0–17.0)
LYMPHS ABS: 2.1 10*3/uL (ref 0.7–4.0)
Lymphocytes Relative: 29.4 % (ref 12.0–46.0)
MCHC: 34.5 g/dL (ref 30.0–36.0)
MCV: 90.8 fl (ref 78.0–100.0)
MONO ABS: 0.6 10*3/uL (ref 0.1–1.0)
Monocytes Relative: 8.1 % (ref 3.0–12.0)
Neutro Abs: 4.3 10*3/uL (ref 1.4–7.7)
Neutrophils Relative %: 59.4 % (ref 43.0–77.0)
PLATELETS: 296 10*3/uL (ref 150.0–400.0)
RBC: 4.74 Mil/uL (ref 4.22–5.81)
RDW: 13.1 % (ref 11.5–15.5)
WBC: 7.2 10*3/uL (ref 4.0–10.5)

## 2014-06-09 LAB — HEPATIC FUNCTION PANEL
ALT: 24 U/L (ref 0–53)
AST: 28 U/L (ref 0–37)
Albumin: 5 g/dL (ref 3.5–5.2)
Alkaline Phosphatase: 53 U/L (ref 39–117)
BILIRUBIN TOTAL: 0.9 mg/dL (ref 0.2–1.2)
Bilirubin, Direct: 0 mg/dL (ref 0.0–0.3)
TOTAL PROTEIN: 7.6 g/dL (ref 6.0–8.3)

## 2014-06-09 LAB — MICROALBUMIN / CREATININE URINE RATIO
CREATININE, U: 123.8 mg/dL
MICROALB UR: 0.5 mg/dL (ref 0.0–1.9)
Microalb Creat Ratio: 0.4 mg/g (ref 0.0–30.0)

## 2014-06-09 LAB — TSH: TSH: 0.41 u[IU]/mL (ref 0.35–4.50)

## 2014-06-09 LAB — PSA: PSA: 0.29 ng/mL (ref 0.10–4.00)

## 2014-06-09 NOTE — Progress Notes (Signed)
Pre visit review using our clinic review tool, if applicable. No additional management support is needed unless otherwise documented below in the visit note. 
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
## 2014-06-09 NOTE — Patient Instructions (Signed)
Preventive Care for Adults A healthy lifestyle and preventive care can promote health and wellness. Preventive health guidelines for men include the following key practices:  A routine yearly physical is a good way to check with your health care provider about your health and preventative screening. It is a chance to share any concerns and updates on your health and to receive a thorough exam.  Visit your dentist for a routine exam and preventative care every 6 months. Brush your teeth twice a day and floss once a day. Good oral hygiene prevents tooth decay and gum disease.  The frequency of eye exams is based on your age, health, family medical history, use of contact lenses, and other factors. Follow your health care provider's recommendations for frequency of eye exams.  Eat a healthy diet. Foods such as vegetables, fruits, whole grains, low-fat dairy products, and lean protein foods contain the nutrients you need without too many calories. Decrease your intake of foods high in solid fats, added sugars, and salt. Eat the right amount of calories for you.Get information about a proper diet from your health care provider, if necessary.  Regular physical exercise is one of the most important things you can do for your health. Most adults should get at least 150 minutes of moderate-intensity exercise (any activity that increases your heart rate and causes you to sweat) each week. In addition, most adults need muscle-strengthening exercises on 2 or more days a week.  Maintain a healthy weight. The body mass index (BMI) is a screening tool to identify possible weight problems. It provides an estimate of body fat based on height and weight. Your health care provider can find your BMI and can help you achieve or maintain a healthy weight.For adults 20 years and older:  A BMI below 18.5 is considered underweight.  A BMI of 18.5 to 24.9 is normal.  A BMI of 25 to 29.9 is considered overweight.  A BMI  of 30 and above is considered obese.  Maintain normal blood lipids and cholesterol levels by exercising and minimizing your intake of saturated fat. Eat a balanced diet with plenty of fruit and vegetables. Blood tests for lipids and cholesterol should begin at age 50 and be repeated every 5 years. If your lipid or cholesterol levels are high, you are over 50, or you are at high risk for heart disease, you may need your cholesterol levels checked more frequently.Ongoing high lipid and cholesterol levels should be treated with medicines if diet and exercise are not working.  If you smoke, find out from your health care provider how to quit. If you do not use tobacco, do not start.  Lung cancer screening is recommended for adults aged 73-80 years who are at high risk for developing lung cancer because of a history of smoking. A yearly low-dose CT scan of the lungs is recommended for people who have at least a 30-pack-year history of smoking and are a current smoker or have quit within the past 15 years. A pack year of smoking is smoking an average of 1 pack of cigarettes a day for 1 year (for example: 1 pack a day for 30 years or 2 packs a day for 15 years). Yearly screening should continue until the smoker has stopped smoking for at least 15 years. Yearly screening should be stopped for people who develop a health problem that would prevent them from having lung cancer treatment.  If you choose to drink alcohol, do not have more than  2 drinks per day. One drink is considered to be 12 ounces (355 mL) of beer, 5 ounces (148 mL) of wine, or 1.5 ounces (44 mL) of liquor.  Avoid use of street drugs. Do not share needles with anyone. Ask for help if you need support or instructions about stopping the use of drugs.  High blood pressure causes heart disease and increases the risk of stroke. Your blood pressure should be checked at least every 1-2 years. Ongoing high blood pressure should be treated with  medicines, if weight loss and exercise are not effective.  If you are 18-73 years old, ask your health care provider if you should take aspirin to prevent heart disease.  Diabetes screening involves taking a blood sample to check your fasting blood sugar level. This should be done once every 3 years, after age 82, if you are within normal weight and without risk factors for diabetes. Testing should be considered at a younger age or be carried out more frequently if you are overweight and have at least 1 risk factor for diabetes.  Colorectal cancer can be detected and often prevented. Most routine colorectal cancer screening begins at the age of 37 and continues through age 46. However, your health care provider may recommend screening at an earlier age if you have risk factors for colon cancer. On a yearly basis, your health care provider may provide home test kits to check for hidden blood in the stool. Use of a small camera at the end of a tube to directly examine the colon (sigmoidoscopy or colonoscopy) can detect the earliest forms of colorectal cancer. Talk to your health care provider about this at age 37, when routine screening begins. Direct exam of the colon should be repeated every 5-10 years through age 74, unless early forms of precancerous polyps or small growths are found.  People who are at an increased risk for hepatitis B should be screened for this virus. You are considered at high risk for hepatitis B if:  You were born in a country where hepatitis B occurs often. Talk with your health care provider about which countries are considered high risk.  Your parents were born in a high-risk country and you have not received a shot to protect against hepatitis B (hepatitis B vaccine).  You have HIV or AIDS.  You use needles to inject street drugs.  You live with, or have sex with, someone who has hepatitis B.  You are a man who has sex with other men (MSM).  You get hemodialysis  treatment.  You take certain medicines for conditions such as cancer, organ transplantation, and autoimmune conditions.  Hepatitis C blood testing is recommended for all people born from [birth year] through 1965 and any individual with known risks for hepatitis C.  Practice safe sex. Use condoms and avoid high-risk sexual practices to reduce the spread of sexually transmitted infections (STIs). STIs include gonorrhea, chlamydia, syphilis, trichomonas, herpes, HPV, and human immunodeficiency virus (HIV). Herpes, HIV, and HPV are viral illnesses that have no cure. They can result in disability, cancer, and death.  If you are at risk of being infected with HIV, it is recommended that you take a prescription medicine daily to prevent HIV infection. This is called preexposure prophylaxis (PrEP). You are considered at risk if:  You are a man who has sex with other men (MSM) and have other risk factors.  You are a heterosexual man, are sexually active, and are at increased risk for HIV infection.  You take drugs by injection.  You are sexually active with a partner who has HIV.  Talk with your health care provider about whether you are at high risk of being infected with HIV. If you choose to begin PrEP, you should first be tested for HIV. You should then be tested every 3 months for as long as you are taking PrEP.  A one-time screening for abdominal aortic aneurysm (AAA) and surgical repair of large AAAs by ultrasound are recommended for men ages 32 to 67 years who are current or former smokers.  Healthy men should no longer receive prostate-specific antigen (PSA) blood tests as part of routine cancer screening. Talk with your health care provider about prostate cancer screening.  Testicular cancer screening is not recommended for adult males who have no symptoms. Screening includes self-exam, a health care provider exam, and other screening tests. Consult with your health care provider about any symptoms  you have or any concerns you have about testicular cancer.  Use sunscreen. Apply sunscreen liberally and repeatedly throughout the day. You should seek shade when your shadow is shorter than you. Protect yourself by wearing long sleeves, pants, a wide-brimmed hat, and sunglasses year round, whenever you are outdoors.  Once a month, do a whole-body skin exam, using a mirror to look at the skin on your back. Tell your health care provider about new moles, moles that have irregular borders, moles that are larger than a pencil eraser, or moles that have changed in shape or color.  Stay current with required vaccines (immunizations).  Influenza vaccine. All adults should be immunized every year.  Tetanus, diphtheria, and acellular pertussis (Td, Tdap) vaccine. An adult who has not previously received Tdap or who does not know his vaccine status should receive 1 dose of Tdap. This initial dose should be followed by tetanus and diphtheria toxoids (Td) booster doses every 10 years. Adults with an unknown or incomplete history of completing a 3-dose immunization series with Td-containing vaccines should begin or complete a primary immunization series including a Tdap dose. Adults should receive a Td booster every 10 years.  Varicella vaccine. An adult without evidence of immunity to varicella should receive 2 doses or a second dose if he has previously received 1 dose.  Human papillomavirus (HPV) vaccine. Males aged 68-21 years who have not received the vaccine previously should receive the 3-dose series. Males aged 22-26 years may be immunized. Immunization is recommended through the age of 6 years for any male who has sex with males and did not get any or all doses earlier. Immunization is recommended for any person with an immunocompromised condition through the age of 49 years if he did not get any or all doses earlier. During the 3-dose series, the second dose should be obtained 4-8 weeks after the first  dose. The third dose should be obtained 24 weeks after the first dose and 16 weeks after the second dose.  Zoster vaccine. One dose is recommended for adults aged 50 years or older unless certain conditions are present.  Measles, mumps, and rubella (MMR) vaccine. Adults born before [birth year] generally are considered immune to measles and mumps. Adults born in [birth year] or later should have 1 or more doses of MMR vaccine unless there is a contraindication to the vaccine or there is laboratory evidence of immunity to each of the three diseases. A routine second dose of MMR vaccine should be obtained at least 28 days after the first dose for students attending postsecondary  schools, health care workers, or international travelers. People who received inactivated measles vaccine or an unknown type of measles vaccine during 1963-1967 should receive 2 doses of MMR vaccine. People who received inactivated mumps vaccine or an unknown type of mumps vaccine before 1979 and are at high risk for mumps infection should consider immunization with 2 doses of MMR vaccine. Unvaccinated health care workers born before 1957 who lack laboratory evidence of measles, mumps, or rubella immunity or laboratory confirmation of disease should consider measles and mumps immunization with 2 doses of MMR vaccine or rubella immunization with 1 dose of MMR vaccine.  Pneumococcal 13-valent conjugate (PCV13) vaccine. When indicated, a person who is uncertain of his immunization history and has no record of immunization should receive the PCV13 vaccine. An adult aged 19 years or older who has certain medical conditions and has not been previously immunized should receive 1 dose of PCV13 vaccine. This PCV13 should be followed with a dose of pneumococcal polysaccharide (PPSV23) vaccine. The PPSV23 vaccine dose should be obtained at least 8 weeks after the dose of PCV13 vaccine. An adult aged 19 years or older who has certain medical conditions and  previously received 1 or more doses of PPSV23 vaccine should receive 1 dose of PCV13. The PCV13 vaccine dose should be obtained 1 or more years after the last PPSV23 vaccine dose.  Pneumococcal polysaccharide (PPSV23) vaccine. When PCV13 is also indicated, PCV13 should be obtained first. All adults aged 65 years and older should be immunized. An adult younger than age 65 years who has certain medical conditions should be immunized. Any person who resides in a nursing home or long-term care facility should be immunized. An adult smoker should be immunized. People with an immunocompromised condition and certain other conditions should receive both PCV13 and PPSV23 vaccines. People with human immunodeficiency virus (HIV) infection should be immunized as soon as possible after diagnosis. Immunization during chemotherapy or radiation therapy should be avoided. Routine use of PPSV23 vaccine is not recommended for American Indians, [place] Natives, or people younger than 65 years unless there are medical conditions that require PPSV23 vaccine. When indicated, people who have unknown immunization and have no record of immunization should receive PPSV23 vaccine. One-time revaccination 5 years after the first dose of PPSV23 is recommended for people aged 19-64 years who have chronic kidney failure, nephrotic syndrome, asplenia, or immunocompromised conditions. People who received 1-2 doses of PPSV23 before age 65 years should receive another dose of PPSV23 vaccine at age 65 years or later if at least 5 years have passed since the previous dose. Doses of PPSV23 are not needed for people immunized with PPSV23 at or after age 65 years.  Meningococcal vaccine. Adults with asplenia or persistent complement component deficiencies should receive 2 doses of quadrivalent meningococcal conjugate (MenACWY-D) vaccine. The doses should be obtained at least 2 months apart. Microbiologists working with certain meningococcal bacteria,  military recruits, people at risk during an outbreak, and people who travel to or live in countries with a high rate of meningitis should be immunized. A first-year college student up through age 21 years who is living in a residence hall should receive a dose if he did not receive a dose on or after his 16th birthday. Adults who have certain high-risk conditions should receive one or more doses of vaccine.  Hepatitis A vaccine. Adults who wish to be protected from this disease, have certain high-risk conditions, work with hepatitis A-infected animals, work in hepatitis A research labs, or   travel to or work in countries with a high rate of hepatitis A should be immunized. Adults who were previously unvaccinated and who anticipate close contact with an international adoptee during the first 60 days after arrival in the [place] from a country with a high rate of hepatitis A should be immunized.  Hepatitis B vaccine. Adults should be immunized if they wish to be protected from this disease, have certain high-risk conditions, may be exposed to blood or other infectious body fluids, are household contacts or sex partners of hepatitis B positive people, are clients or workers in certain care facilities, or travel to or work in countries with a high rate of hepatitis B.  Haemophilus influenzae type b (Hib) vaccine. A previously unvaccinated person with asplenia or sickle cell disease or having a scheduled splenectomy should receive 1 dose of Hib vaccine. Regardless of previous immunization, a recipient of a hematopoietic stem cell transplant should receive a 3-dose series 6-12 months after his successful transplant. Hib vaccine is not recommended for adults with HIV infection. Preventive Service / Frequency Ages 52 to 17  Blood pressure check.** / Every 1 to 2 years.  Lipid and cholesterol check.** / Every 5 years beginning at age 69.  Hepatitis C blood test.** / For any individual with known risks for  hepatitis C.  Skin self-exam. / Monthly.  Influenza vaccine. / Every year.  Tetanus, diphtheria, and acellular pertussis (Tdap, Td) vaccine.** / Consult your health care provider. 1 dose of Td every 10 years.  Varicella vaccine.** / Consult your health care provider.  HPV vaccine. / 3 doses over 6 months, if 72 or younger.  Measles, mumps, rubella (MMR) vaccine.** / You need at least 1 dose of MMR if you were born in 1957 or later. You may also need a second dose.  Pneumococcal 13-valent conjugate (PCV13) vaccine.** / Consult your health care provider.  Pneumococcal polysaccharide (PPSV23) vaccine.** / 1 to 2 doses if you smoke cigarettes or if you have certain conditions.  Meningococcal vaccine.** / 1 dose if you are age 35 to 60 years and a Market researcher living in a residence hall, or have one of several medical conditions. You may also need additional booster doses.  Hepatitis A vaccine.** / Consult your health care provider.  Hepatitis B vaccine.** / Consult your health care provider.  Haemophilus influenzae type b (Hib) vaccine.** / Consult your health care provider. Ages 35 to 8  Blood pressure check.** / Every 1 to 2 years.  Lipid and cholesterol check.** / Every 5 years beginning at age 57.  Lung cancer screening. / Every year if you are aged 44-80 years and have a 30-pack-year history of smoking and currently smoke or have quit within the past 15 years. Yearly screening is stopped once you have quit smoking for at least 15 years or develop a health problem that would prevent you from having lung cancer treatment.  Fecal occult blood test (FOBT) of stool. / Every year beginning at age 55 and continuing until age 73. You may not have to do this test if you get a colonoscopy every 10 years.  Flexible sigmoidoscopy** or colonoscopy.** / Every 5 years for a flexible sigmoidoscopy or every 10 years for a colonoscopy beginning at age 28 and continuing until age  1.  Hepatitis C blood test.** / For all people born from [birth year] through 1965 and any individual with known risks for hepatitis C.  Skin self-exam. / Monthly.  Influenza vaccine. / Every  year.  Tetanus, diphtheria, and acellular pertussis (Tdap/Td) vaccine.** / Consult your health care provider. 1 dose of Td every 10 years.  Varicella vaccine.** / Consult your health care provider.  Zoster vaccine.** / 1 dose for adults aged 76 years or older.  Measles, mumps, rubella (MMR) vaccine.** / You need at least 1 dose of MMR if you were born in 1957 or later. You may also need a second dose.  Pneumococcal 13-valent conjugate (PCV13) vaccine.** / Consult your health care provider.  Pneumococcal polysaccharide (PPSV23) vaccine.** / 1 to 2 doses if you smoke cigarettes or if you have certain conditions.  Meningococcal vaccine.** / Consult your health care provider.  Hepatitis A vaccine.** / Consult your health care provider.  Hepatitis B vaccine.** / Consult your health care provider.  Haemophilus influenzae type b (Hib) vaccine.** / Consult your health care provider. Ages 50 and over  Blood pressure check.** / Every 1 to 2 years.  Lipid and cholesterol check.**/ Every 5 years beginning at age 97.  Lung cancer screening. / Every year if you are aged 36-80 years and have a 30-pack-year history of smoking and currently smoke or have quit within the past 15 years. Yearly screening is stopped once you have quit smoking for at least 15 years or develop a health problem that would prevent you from having lung cancer treatment.  Fecal occult blood test (FOBT) of stool. / Every year beginning at age 71 and continuing until age 70. You may not have to do this test if you get a colonoscopy every 10 years.  Flexible sigmoidoscopy** or colonoscopy.** / Every 5 years for a flexible sigmoidoscopy or every 10 years for a colonoscopy beginning at age 37 and continuing until age 53.  Hepatitis C blood  test.** / For all people born from [birth year] through 1964 and any individual with known risks for hepatitis C.  Abdominal aortic aneurysm (AAA) screening.** / A one-time screening for ages 66 to 32 years who are current or former smokers.  Skin self-exam. / Monthly.  Influenza vaccine. / Every year.  Tetanus, diphtheria, and acellular pertussis (Tdap/Td) vaccine.** / 1 dose of Td every 10 years.  Varicella vaccine.** / Consult your health care provider.  Zoster vaccine.** / 1 dose for adults aged 27 years or older.  Pneumococcal 13-valent conjugate (PCV13) vaccine.** / Consult your health care provider.  Pneumococcal polysaccharide (PPSV23) vaccine.** / 1 dose for all adults aged 73 years and older.  Meningococcal vaccine.** / Consult your health care provider.  Hepatitis A vaccine.** / Consult your health care provider.  Hepatitis B vaccine.** / Consult your health care provider.  Haemophilus influenzae type b (Hib) vaccine.** / Consult your health care provider. **Family history and personal history of risk and conditions may change your health care provider's recommendations. Document Released: 10/28/2001 Document Revised: 09/06/2013 Document Reviewed: 01/27/2011 [facility] Patient Information 2015 [place], [place]. This information is not intended to replace advice given to you by your health care provider. Make sure you discuss any questions you have with your health care provider.

## 2014-06-12 ENCOUNTER — Telehealth: Payer: Self-pay | Admitting: Family Medicine

## 2014-06-12 NOTE — Telephone Encounter (Signed)
emmi emailed °

## 2014-06-13 ENCOUNTER — Other Ambulatory Visit: Payer: Self-pay

## 2014-06-13 MED ORDER — ATORVASTATIN CALCIUM 20 MG PO TABS: ORAL_TABLET | ORAL | Status: DC

## 2014-06-13 MED ORDER — FENOFIBRATE MICRONIZED 130 MG PO CAPS: ORAL_CAPSULE | ORAL | Status: DC

## 2014-06-13 NOTE — Addendum Note (Signed)
Addended by: Ewing Schlein on: 06/13/2014 04:31 PM   Modules accepted: Orders

## 2014-08-23 ENCOUNTER — Telehealth: Payer: Self-pay | Admitting: Family Medicine

## 2014-08-23 NOTE — Telephone Encounter (Signed)
Caller name: Ethan Relation to pt: self Call back number: [phone] Pharmacy[facility] [place]  Reason for call:   Patient wants to try to stop smoking again and would like to know if Dr. Etter Sjogren would call in an rx for chantix?

## 2014-08-24 ENCOUNTER — Other Ambulatory Visit: Payer: Self-pay | Admitting: Family Medicine

## 2014-08-24 DIAGNOSIS — F17200 Nicotine dependence, unspecified, uncomplicated: Secondary | ICD-10-CM

## 2014-08-24 MED ORDER — VARENICLINE TARTRATE 0.5 MG X 11 & 1 MG X 42 PO MISC: ORAL | Status: DC

## 2014-08-24 NOTE — Telephone Encounter (Signed)
Please advise      KP 

## 2014-08-24 NOTE — Telephone Encounter (Signed)
chantix starter pack--- #1  Sent to pharmacy

## 2014-09-05 ENCOUNTER — Ambulatory Visit: Payer: Self-pay

## 2014-09-05 LAB — URINALYSIS, COMPLETE
BACTERIA: NEGATIVE
Bilirubin,UR: NEGATIVE
Blood: NEGATIVE
GLUCOSE, UR: NEGATIVE
Ketone: NEGATIVE
LEUKOCYTE ESTERASE: NEGATIVE
NITRITE: NEGATIVE
PH: 7.5 (ref 5.0–8.0)
Protein: NEGATIVE
SPECIFIC GRAVITY: 1.025 (ref 1.000–1.030)
Squamous Epithelial: NONE SEEN

## 2014-10-02 ENCOUNTER — Telehealth: Payer: Self-pay | Admitting: Family Medicine

## 2014-10-02 MED ORDER — VARENICLINE TARTRATE 1 MG PO TABS: 1.0000 mg | ORAL_TABLET | Freq: Two times a day (BID) | ORAL | Status: DC

## 2014-10-02 NOTE — Telephone Encounter (Signed)
Continuing month pack sent.       KP

## 2014-10-02 NOTE — Telephone Encounter (Signed)
Caller name: Kross Relation to pt: self  Call back number: [phone] [facility]: [facility] [address]  Reason for call:   Requesting a chantix refill

## 2014-11-29 ENCOUNTER — Encounter: Payer: Self-pay | Admitting: Gastroenterology

## 2014-12-14 ENCOUNTER — Other Ambulatory Visit: Payer: Self-pay | Admitting: Family Medicine

## 2014-12-14 NOTE — Telephone Encounter (Signed)
Last seen 06/09/14 and filled 10/02/14 #60 with 1 rf.   Please advise     KP

## 2015-01-15 ENCOUNTER — Telehealth: Payer: Self-pay | Admitting: Family Medicine

## 2015-01-15 MED ORDER — VARENICLINE TARTRATE 1 MG PO TABS: 1.0000 mg | ORAL_TABLET | Freq: Two times a day (BID) | ORAL | Status: DC

## 2015-01-15 NOTE — Telephone Encounter (Signed)
Caller name: Rodrick Relation to pt: self Call back number: [phone] [facility]acy: [facility]  on [address]  Reason for call:   Requesting a refill of chantix.

## 2015-01-15 NOTE — Telephone Encounter (Signed)
MSG left, I need to know if the patient is on the continuing pack or if he is starting over.     KP

## 2015-01-15 NOTE — Telephone Encounter (Signed)
Rx faxed.    KP 

## 2015-01-15 NOTE — Telephone Encounter (Signed)
Pt called stating Chantix is a continuation (not a starter pack).  [facility] ph # [phone]

## 2015-01-23 ENCOUNTER — Other Ambulatory Visit: Payer: Self-pay

## 2015-01-23 MED ORDER — ATORVASTATIN CALCIUM 20 MG PO TABS: ORAL_TABLET | ORAL | Status: DC

## 2015-01-23 MED ORDER — FENOFIBRATE MICRONIZED 130 MG PO CAPS: ORAL_CAPSULE | ORAL | Status: DC

## 2015-02-21 ENCOUNTER — Other Ambulatory Visit: Payer: Self-pay | Admitting: Family Medicine

## 2015-03-06 ENCOUNTER — Ambulatory Visit: Payer: BLUE CROSS/BLUE SHIELD | Admitting: Family Medicine

## 2015-03-08 LAB — LIPID PANEL
Cholesterol: 162 mg/dL (ref 0–200)
HDL: 32 mg/dL — AB (ref 35–70)
LDL Cholesterol: 66 mg/dL
LDl/HDL Ratio: 5
TRIGLYCERIDES: 319 mg/dL — AB (ref 40–160)

## 2015-03-08 LAB — BASIC METABOLIC PANEL: Glucose: 114 mg/dL

## 2015-03-08 LAB — HEMOGLOBIN A1C: Hgb A1c MFr Bld: 5 % (ref 4.0–6.0)

## 2015-03-23 ENCOUNTER — Other Ambulatory Visit: Payer: Self-pay | Admitting: Family Medicine

## 2015-03-27 ENCOUNTER — Other Ambulatory Visit: Payer: Self-pay | Admitting: Family Medicine

## 2015-03-27 NOTE — Telephone Encounter (Signed)
Last seen 06/09/14 and filled 01/15/15 #60 with 1 refill Please advise     KP

## 2015-03-28 ENCOUNTER — Encounter: Payer: Self-pay | Admitting: Family Medicine

## 2015-04-24 ENCOUNTER — Other Ambulatory Visit: Payer: Self-pay | Admitting: Family Medicine

## 2015-06-15 ENCOUNTER — Other Ambulatory Visit: Payer: Self-pay | Admitting: Family Medicine

## 2015-06-18 ENCOUNTER — Ambulatory Visit (INDEPENDENT_AMBULATORY_CARE_PROVIDER_SITE_OTHER): Payer: BLUE CROSS/BLUE SHIELD | Admitting: Family Medicine

## 2015-06-18 ENCOUNTER — Encounter: Payer: Self-pay | Admitting: Family Medicine

## 2015-06-18 VITALS — BP 130/76 | HR 67 | Temp 98.2°F | Ht 69.0 in | Wt 189.2 lb

## 2015-06-18 DIAGNOSIS — R7989 Other specified abnormal findings of blood chemistry: Secondary | ICD-10-CM

## 2015-06-18 DIAGNOSIS — Z0000 Encounter for general adult medical examination without abnormal findings: Secondary | ICD-10-CM | POA: Diagnosis not present

## 2015-06-18 DIAGNOSIS — E785 Hyperlipidemia, unspecified: Secondary | ICD-10-CM | POA: Diagnosis not present

## 2015-06-18 DIAGNOSIS — Z86010 Personal history of colonic polyps: Secondary | ICD-10-CM

## 2015-06-18 DIAGNOSIS — F17200 Nicotine dependence, unspecified, uncomplicated: Secondary | ICD-10-CM

## 2015-06-18 NOTE — Patient Instructions (Signed)
Preventive Care for Adults A healthy lifestyle and preventive care can promote health and wellness. Preventive health guidelines for men include the following key practices:  A routine yearly physical is a good way to check with your health care provider about your health and preventative screening. It is a chance to share any concerns and updates on your health and to receive a thorough exam.  Visit your dentist for a routine exam and preventative care every 6 months. Brush your teeth twice a day and floss once a day. Good oral hygiene prevents tooth decay and gum disease.  The frequency of eye exams is based on your age, health, family medical history, use of contact lenses, and other factors. Follow your health care provider's recommendations for frequency of eye exams.  Eat a healthy diet. Foods such as vegetables, fruits, whole grains, low-fat dairy products, and lean protein foods contain the nutrients you need without too many calories. Decrease your intake of foods high in solid fats, added sugars, and salt. Eat the right amount of calories for you.Get information about a proper diet from your health care provider, if necessary.  Regular physical exercise is one of the most important things you can do for your health. Most adults should get at least 150 minutes of moderate-intensity exercise (any activity that increases your heart rate and causes you to sweat) each week. In addition, most adults need muscle-strengthening exercises on 2 or more days a week.  Maintain a healthy weight. The body mass index (BMI) is a screening tool to identify possible weight problems. It provides an estimate of body fat based on height and weight. Your health care provider can find your BMI and can help you achieve or maintain a healthy weight.For adults 20 years and older:  A BMI below 18.5 is considered underweight.  A BMI of 18.5 to 24.9 is normal.  A BMI of 25 to 29.9 is considered overweight.  A BMI  of 30 and above is considered obese.  Maintain normal blood lipids and cholesterol levels by exercising and minimizing your intake of saturated fat. Eat a balanced diet with plenty of fruit and vegetables. Blood tests for lipids and cholesterol should begin at age 50 and be repeated every 5 years. If your lipid or cholesterol levels are high, you are over 50, or you are at high risk for heart disease, you may need your cholesterol levels checked more frequently.Ongoing high lipid and cholesterol levels should be treated with medicines if diet and exercise are not working.  If you smoke, find out from your health care provider how to quit. If you do not use tobacco, do not start.  Lung cancer screening is recommended for adults aged 73-80 years who are at high risk for developing lung cancer because of a history of smoking. A yearly low-dose CT scan of the lungs is recommended for people who have at least a 30-pack-year history of smoking and are a current smoker or have quit within the past 15 years. A pack year of smoking is smoking an average of 1 pack of cigarettes a day for 1 year (for example: 1 pack a day for 30 years or 2 packs a day for 15 years). Yearly screening should continue until the smoker has stopped smoking for at least 15 years. Yearly screening should be stopped for people who develop a health problem that would prevent them from having lung cancer treatment.  If you choose to drink alcohol, do not have more than  2 drinks per day. One drink is considered to be 12 ounces (355 mL) of beer, 5 ounces (148 mL) of wine, or 1.5 ounces (44 mL) of liquor.  Avoid use of street drugs. Do not share needles with anyone. Ask for help if you need support or instructions about stopping the use of drugs.  High blood pressure causes heart disease and increases the risk of stroke. Your blood pressure should be checked at least every 1-2 years. Ongoing high blood pressure should be treated with  medicines, if weight loss and exercise are not effective.  If you are 45-79 years old, ask your health care provider if you should take aspirin to prevent heart disease.  Diabetes screening involves taking a blood sample to check your fasting blood sugar level. This should be done once every 3 years, after age 45, if you are within normal weight and without risk factors for diabetes. Testing should be considered at a younger age or be carried out more frequently if you are overweight and have at least 1 risk factor for diabetes.  Colorectal cancer can be detected and often prevented. Most routine colorectal cancer screening begins at the age of 50 and continues through age 75. However, your health care provider may recommend screening at an earlier age if you have risk factors for colon cancer. On a yearly basis, your health care provider may provide home test kits to check for hidden blood in the stool. Use of a small camera at the end of a tube to directly examine the colon (sigmoidoscopy or colonoscopy) can detect the earliest forms of colorectal cancer. Talk to your health care provider about this at age 50, when routine screening begins. Direct exam of the colon should be repeated every 5-10 years through age 75, unless early forms of precancerous polyps or small growths are found.  People who are at an increased risk for hepatitis B should be screened for this virus. You are considered at high risk for hepatitis B if:  You were born in a country where hepatitis B occurs often. Talk with your health care provider about which countries are considered high risk.  Your parents were born in a high-risk country and you have not received a shot to protect against hepatitis B (hepatitis B vaccine).  You have HIV or AIDS.  You use needles to inject street drugs.  You live with, or have sex with, someone who has hepatitis B.  You are a man who has sex with other men (MSM).  You get hemodialysis  treatment.  You take certain medicines for conditions such as cancer, organ transplantation, and autoimmune conditions.  Hepatitis C blood testing is recommended for all people born from 1945 through 1965 and any individual with known risks for hepatitis C.  Practice safe sex. Use condoms and avoid high-risk sexual practices to reduce the spread of sexually transmitted infections (STIs). STIs include gonorrhea, chlamydia, syphilis, trichomonas, herpes, HPV, and human immunodeficiency virus (HIV). Herpes, HIV, and HPV are viral illnesses that have no cure. They can result in disability, cancer, and death.  If you are at risk of being infected with HIV, it is recommended that you take a prescription medicine daily to prevent HIV infection. This is called preexposure prophylaxis (PrEP). You are considered at risk if:  You are a man who has sex with other men (MSM) and have other risk factors.  You are a heterosexual man, are sexually active, and are at increased risk for HIV infection.    You take drugs by injection.  You are sexually active with a partner who has HIV.  Talk with your health care provider about whether you are at high risk of being infected with HIV. If you choose to begin PrEP, you should first be tested for HIV. You should then be tested every 3 months for as long as you are taking PrEP.  A one-time screening for abdominal aortic aneurysm (AAA) and surgical repair of large AAAs by ultrasound are recommended for men ages 32 to 67 years who are current or former smokers.  Healthy men should no longer receive prostate-specific antigen (PSA) blood tests as part of routine cancer screening. Talk with your health care provider about prostate cancer screening.  Testicular cancer screening is not recommended for adult males who have no symptoms. Screening includes self-exam, a health care provider exam, and other screening tests. Consult with your health care provider about any symptoms  you have or any concerns you have about testicular cancer.  Use sunscreen. Apply sunscreen liberally and repeatedly throughout the day. You should seek shade when your shadow is shorter than you. Protect yourself by wearing long sleeves, pants, a wide-brimmed hat, and sunglasses year round, whenever you are outdoors.  Once a month, do a whole-body skin exam, using a mirror to look at the skin on your back. Tell your health care provider about new moles, moles that have irregular borders, moles that are larger than a pencil eraser, or moles that have changed in shape or color.  Stay current with required vaccines (immunizations).  Influenza vaccine. All adults should be immunized every year.  Tetanus, diphtheria, and acellular pertussis (Td, Tdap) vaccine. An adult who has not previously received Tdap or who does not know his vaccine status should receive 1 dose of Tdap. This initial dose should be followed by tetanus and diphtheria toxoids (Td) booster doses every 10 years. Adults with an unknown or incomplete history of completing a 3-dose immunization series with Td-containing vaccines should begin or complete a primary immunization series including a Tdap dose. Adults should receive a Td booster every 10 years.  Varicella vaccine. An adult without evidence of immunity to varicella should receive 2 doses or a second dose if he has previously received 1 dose.  Human papillomavirus (HPV) vaccine. Males aged 68-21 years who have not received the vaccine previously should receive the 3-dose series. Males aged 22-26 years may be immunized. Immunization is recommended through the age of 6 years for any male who has sex with males and did not get any or all doses earlier. Immunization is recommended for any person with an immunocompromised condition through the age of 49 years if he did not get any or all doses earlier. During the 3-dose series, the second dose should be obtained 4-8 weeks after the first  dose. The third dose should be obtained 24 weeks after the first dose and 16 weeks after the second dose.  Zoster vaccine. One dose is recommended for adults aged 50 years or older unless certain conditions are present.  Measles, mumps, and rubella (MMR) vaccine. Adults born before [birth year] generally are considered immune to measles and mumps. Adults born in [birth year] or later should have 1 or more doses of MMR vaccine unless there is a contraindication to the vaccine or there is laboratory evidence of immunity to each of the three diseases. A routine second dose of MMR vaccine should be obtained at least 28 days after the first dose for students attending postsecondary  schools, health care workers, or international travelers. People who received inactivated measles vaccine or an unknown type of measles vaccine during 1963-1967 should receive 2 doses of MMR vaccine. People who received inactivated mumps vaccine or an unknown type of mumps vaccine before 1979 and are at high risk for mumps infection should consider immunization with 2 doses of MMR vaccine. Unvaccinated health care workers born before 1957 who lack laboratory evidence of measles, mumps, or rubella immunity or laboratory confirmation of disease should consider measles and mumps immunization with 2 doses of MMR vaccine or rubella immunization with 1 dose of MMR vaccine.  Pneumococcal 13-valent conjugate (PCV13) vaccine. When indicated, a person who is uncertain of his immunization history and has no record of immunization should receive the PCV13 vaccine. An adult aged 19 years or older who has certain medical conditions and has not been previously immunized should receive 1 dose of PCV13 vaccine. This PCV13 should be followed with a dose of pneumococcal polysaccharide (PPSV23) vaccine. The PPSV23 vaccine dose should be obtained at least 8 weeks after the dose of PCV13 vaccine. An adult aged 19 years or older who has certain medical conditions and  previously received 1 or more doses of PPSV23 vaccine should receive 1 dose of PCV13. The PCV13 vaccine dose should be obtained 1 or more years after the last PPSV23 vaccine dose.  Pneumococcal polysaccharide (PPSV23) vaccine. When PCV13 is also indicated, PCV13 should be obtained first. All adults aged 65 years and older should be immunized. An adult younger than age 65 years who has certain medical conditions should be immunized. Any person who resides in a nursing home or long-term care facility should be immunized. An adult smoker should be immunized. People with an immunocompromised condition and certain other conditions should receive both PCV13 and PPSV23 vaccines. People with human immunodeficiency virus (HIV) infection should be immunized as soon as possible after diagnosis. Immunization during chemotherapy or radiation therapy should be avoided. Routine use of PPSV23 vaccine is not recommended for American Indians, [place] Natives, or people younger than 65 years unless there are medical conditions that require PPSV23 vaccine. When indicated, people who have unknown immunization and have no record of immunization should receive PPSV23 vaccine. One-time revaccination 5 years after the first dose of PPSV23 is recommended for people aged 19-64 years who have chronic kidney failure, nephrotic syndrome, asplenia, or immunocompromised conditions. People who received 1-2 doses of PPSV23 before age 65 years should receive another dose of PPSV23 vaccine at age 65 years or later if at least 5 years have passed since the previous dose. Doses of PPSV23 are not needed for people immunized with PPSV23 at or after age 65 years.  Meningococcal vaccine. Adults with asplenia or persistent complement component deficiencies should receive 2 doses of quadrivalent meningococcal conjugate (MenACWY-D) vaccine. The doses should be obtained at least 2 months apart. Microbiologists working with certain meningococcal bacteria,  military recruits, people at risk during an outbreak, and people who travel to or live in countries with a high rate of meningitis should be immunized. A first-year college student up through age 21 years who is living in a residence hall should receive a dose if he did not receive a dose on or after his 16th birthday. Adults who have certain high-risk conditions should receive one or more doses of vaccine.  Hepatitis A vaccine. Adults who wish to be protected from this disease, have certain high-risk conditions, work with hepatitis A-infected animals, work in hepatitis A research labs, or   travel to or work in countries with a high rate of hepatitis A should be immunized. Adults who were previously unvaccinated and who anticipate close contact with an international adoptee during the first 60 days after arrival in the [place] from a country with a high rate of hepatitis A should be immunized.  Hepatitis B vaccine. Adults should be immunized if they wish to be protected from this disease, have certain high-risk conditions, may be exposed to blood or other infectious body fluids, are household contacts or sex partners of hepatitis B positive people, are clients or workers in certain care facilities, or travel to or work in countries with a high rate of hepatitis B.  Haemophilus influenzae type b (Hib) vaccine. A previously unvaccinated person with asplenia or sickle cell disease or having a scheduled splenectomy should receive 1 dose of Hib vaccine. Regardless of previous immunization, a recipient of a hematopoietic stem cell transplant should receive a 3-dose series 6-12 months after his successful transplant. Hib vaccine is not recommended for adults with HIV infection. Preventive Service / Frequency Ages 52 to 17  Blood pressure check.** / Every 1 to 2 years.  Lipid and cholesterol check.** / Every 5 years beginning at age 69.  Hepatitis C blood test.** / For any individual with known risks for  hepatitis C.  Skin self-exam. / Monthly.  Influenza vaccine. / Every year.  Tetanus, diphtheria, and acellular pertussis (Tdap, Td) vaccine.** / Consult your health care provider. 1 dose of Td every 10 years.  Varicella vaccine.** / Consult your health care provider.  HPV vaccine. / 3 doses over 6 months, if 72 or younger.  Measles, mumps, rubella (MMR) vaccine.** / You need at least 1 dose of MMR if you were born in 1957 or later. You may also need a second dose.  Pneumococcal 13-valent conjugate (PCV13) vaccine.** / Consult your health care provider.  Pneumococcal polysaccharide (PPSV23) vaccine.** / 1 to 2 doses if you smoke cigarettes or if you have certain conditions.  Meningococcal vaccine.** / 1 dose if you are age 35 to 60 years and a Market researcher living in a residence hall, or have one of several medical conditions. You may also need additional booster doses.  Hepatitis A vaccine.** / Consult your health care provider.  Hepatitis B vaccine.** / Consult your health care provider.  Haemophilus influenzae type b (Hib) vaccine.** / Consult your health care provider. Ages 35 to 8  Blood pressure check.** / Every 1 to 2 years.  Lipid and cholesterol check.** / Every 5 years beginning at age 57.  Lung cancer screening. / Every year if you are aged 44-80 years and have a 30-pack-year history of smoking and currently smoke or have quit within the past 15 years. Yearly screening is stopped once you have quit smoking for at least 15 years or develop a health problem that would prevent you from having lung cancer treatment.  Fecal occult blood test (FOBT) of stool. / Every year beginning at age 55 and continuing until age 73. You may not have to do this test if you get a colonoscopy every 10 years.  Flexible sigmoidoscopy** or colonoscopy.** / Every 5 years for a flexible sigmoidoscopy or every 10 years for a colonoscopy beginning at age 28 and continuing until age  1.  Hepatitis C blood test.** / For all people born from [birth year] through 1965 and any individual with known risks for hepatitis C.  Skin self-exam. / Monthly.  Influenza vaccine. / Every  year.  Tetanus, diphtheria, and acellular pertussis (Tdap/Td) vaccine.** / Consult your health care provider. 1 dose of Td every 10 years.  Varicella vaccine.** / Consult your health care provider.  Zoster vaccine.** / 1 dose for adults aged 53 years or older.  Measles, mumps, rubella (MMR) vaccine.** / You need at least 1 dose of MMR if you were born in 1957 or later. You may also need a second dose.  Pneumococcal 13-valent conjugate (PCV13) vaccine.** / Consult your health care provider.  Pneumococcal polysaccharide (PPSV23) vaccine.** / 1 to 2 doses if you smoke cigarettes or if you have certain conditions.  Meningococcal vaccine.** / Consult your health care provider.  Hepatitis A vaccine.** / Consult your health care provider.  Hepatitis B vaccine.** / Consult your health care provider.  Haemophilus influenzae type b (Hib) vaccine.** / Consult your health care provider. Ages 77 and over  Blood pressure check.** / Every 1 to 2 years.  Lipid and cholesterol check.**/ Every 5 years beginning at age 85.  Lung cancer screening. / Every year if you are aged 55-80 years and have a 30-pack-year history of smoking and currently smoke or have quit within the past 15 years. Yearly screening is stopped once you have quit smoking for at least 15 years or develop a health problem that would prevent you from having lung cancer treatment.  Fecal occult blood test (FOBT) of stool. / Every year beginning at age 33 and continuing until age 11. You may not have to do this test if you get a colonoscopy every 10 years.  Flexible sigmoidoscopy** or colonoscopy.** / Every 5 years for a flexible sigmoidoscopy or every 10 years for a colonoscopy beginning at age 28 and continuing until age 73.  Hepatitis C blood  test.** / For all people born from [birth year] through 1965 and any individual with known risks for hepatitis C.  Abdominal aortic aneurysm (AAA) screening.** / A one-time screening for ages 50 to 27 years who are current or former smokers.  Skin self-exam. / Monthly.  Influenza vaccine. / Every year.  Tetanus, diphtheria, and acellular pertussis (Tdap/Td) vaccine.** / 1 dose of Td every 10 years.  Varicella vaccine.** / Consult your health care provider.  Zoster vaccine.** / 1 dose for adults aged 34 years or older.  Pneumococcal 13-valent conjugate (PCV13) vaccine.** / Consult your health care provider.  Pneumococcal polysaccharide (PPSV23) vaccine.** / 1 dose for all adults aged 63 years and older.  Meningococcal vaccine.** / Consult your health care provider.  Hepatitis A vaccine.** / Consult your health care provider.  Hepatitis B vaccine.** / Consult your health care provider.  Haemophilus influenzae type b (Hib) vaccine.** / Consult your health care provider. **Family history and personal history of risk and conditions may change your health care provider's recommendations. Document Released: 10/28/2001 Document Revised: 09/06/2013 Document Reviewed: 01/27/2011 [facility] Patient Information 2015 [place], [place]. This information is not intended to replace advice given to you by your health care provider. Make sure you discuss any questions you have with your health care provider.

## 2015-06-18 NOTE — Progress Notes (Signed)
Pre visit review using our clinic review tool, if applicable. No additional management support is needed unless otherwise documented below in the visit note. 

## 2015-06-18 NOTE — Assessment & Plan Note (Signed)
Cont lipitor. Check labs.
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
## 2015-06-18 NOTE — Assessment & Plan Note (Signed)
ghm utd Check labs See AVS 

## 2015-06-19 LAB — COMPREHENSIVE METABOLIC PANEL WITH GFR
ALT: 29 U/L (ref 0–53)
AST: 23 U/L (ref 0–37)
Albumin: 4.5 g/dL (ref 3.5–5.2)
Alkaline Phosphatase: 45 U/L (ref 39–117)
BUN: 17 mg/dL (ref 6–23)
CO2: 26 meq/L (ref 19–32)
Calcium: 10 mg/dL (ref 8.4–10.5)
Chloride: 105 meq/L (ref 96–112)
Creatinine, Ser: 1 mg/dL (ref 0.40–1.50)
GFR: 83.09 mL/min
Glucose, Bld: 95 mg/dL (ref 70–99)
Potassium: 4.3 meq/L (ref 3.5–5.1)
Sodium: 139 meq/L (ref 135–145)
Total Bilirubin: 0.4 mg/dL (ref 0.2–1.2)
Total Protein: 7 g/dL (ref 6.0–8.3)

## 2015-06-19 LAB — CBC WITH DIFFERENTIAL/PLATELET
Basophils Absolute: 0 K/uL (ref 0.0–0.1)
Basophils Relative: 0.3 % (ref 0.0–3.0)
Eosinophils Absolute: 0.3 K/uL (ref 0.0–0.7)
Eosinophils Relative: 5.3 % — ABNORMAL HIGH (ref 0.0–5.0)
HCT: 42.9 % (ref 39.0–52.0)
Hemoglobin: 14.6 g/dL (ref 13.0–17.0)
Lymphocytes Relative: 38.8 % (ref 12.0–46.0)
Lymphs Abs: 2.1 K/uL (ref 0.7–4.0)
MCHC: 34 g/dL (ref 30.0–36.0)
MCV: 90.7 fl (ref 78.0–100.0)
Monocytes Absolute: 0.4 K/uL (ref 0.1–1.0)
Monocytes Relative: 7.4 % (ref 3.0–12.0)
Neutro Abs: 2.6 K/uL (ref 1.4–7.7)
Neutrophils Relative %: 48.2 % (ref 43.0–77.0)
Platelets: 287 K/uL (ref 150.0–400.0)
RBC: 4.73 Mil/uL (ref 4.22–5.81)
RDW: 13.2 % (ref 11.5–15.5)
WBC: 5.5 K/uL (ref 4.0–10.5)

## 2015-06-19 LAB — HEPATITIS C ANTIBODY: HCV Ab: NEGATIVE

## 2015-06-19 LAB — TSH: TSH: 1.19 u[IU]/mL (ref 0.35–4.50)

## 2015-06-19 LAB — LIPID PANEL
CHOL/HDL RATIO: 4
CHOLESTEROL: 149 mg/dL (ref 0–200)
HDL: 34.1 mg/dL — AB (ref >39.00)
Triglycerides: 423 mg/dL — ABNORMAL HIGH (ref 0.0–149.0)

## 2015-06-19 LAB — LDL CHOLESTEROL, DIRECT: LDL DIRECT: 80 mg/dL

## 2015-06-19 LAB — HIV ANTIBODY (ROUTINE TESTING W REFLEX): HIV 1&2 Ab, 4th Generation: NONREACTIVE

## 2015-08-10 ENCOUNTER — Other Ambulatory Visit: Payer: Self-pay | Admitting: Family Medicine

## 2015-11-07 ENCOUNTER — Telehealth: Payer: Self-pay | Admitting: *Deleted

## 2015-11-07 NOTE — Telephone Encounter (Signed)
Received fax from [place] that patient's Insurance no longer covers Fenofibrate 130 mg caps; given alternative to change to Fenofibrate 160mg  OR 54mg , "please send in new dose or contact Insurance for [place]"/SLS 02/22 Please Advise.

## 2015-11-07 NOTE — Telephone Encounter (Signed)
Ok to change to 160 mg

## 2015-11-08 MED ORDER — FENOFIBRATE 160 MG PO TABS: 160.0000 mg | ORAL_TABLET | Freq: Every day | ORAL | Status: DC

## 2015-11-08 NOTE — Telephone Encounter (Signed)
Rx faxed and the patient's number is not working so I left a VM on his wife's phone to give [place] a call back.     KP

## 2015-12-06 ENCOUNTER — Telehealth: Payer: Self-pay | Admitting: Family Medicine

## 2015-12-06 NOTE — Telephone Encounter (Signed)
Patient needs to get his formulary and send it to [place]. I have tried to contact him but his VM is not setup yet.    KP

## 2015-12-06 NOTE — Telephone Encounter (Signed)
Caller name: Randy [name] Relation to pt: self Call back number: [phone]  Pharmacy: [id number] - [place], [place]  Reason for call: Pt came in office stating has changed insurance for this year to [facility] (pt had before BCBS 2016) and mentioned that his insurance did not cover his last prescription for fenofibrate 160 MG tablet (pt had to pay $89.00 for 15 pills) pt states has only 8 pills left and is needing a new prescription but that will cover his insurance. (pt was informed about bring in his health insurance book with all prescription listed that his insurance will cover but pt is not sure if he has that book with him). Please advise ASAP.

## 2015-12-07 NOTE — Telephone Encounter (Signed)
Spoke with pt and informed the below, pt states will come by today 0(12-07-15) at office with formulary.

## 2015-12-07 NOTE — Telephone Encounter (Signed)
Pt came in office and dropped off his Insurance Prescription Drug Book for 2017 so PCP can verify prescription.

## 2015-12-10 NOTE — Telephone Encounter (Signed)
Gemfibrozil 600 mg bid #60  2 refills

## 2015-12-10 NOTE — Telephone Encounter (Signed)
Patient needs an alternative to fenofibrate. Formulary in the red folder.    KP

## 2015-12-11 ENCOUNTER — Other Ambulatory Visit: Payer: Self-pay | Admitting: Family Medicine

## 2015-12-11 MED ORDER — GEMFIBROZIL 600 MG PO TABS: 600.0000 mg | ORAL_TABLET | Freq: Two times a day (BID) | ORAL | Status: DC

## 2015-12-11 NOTE — Telephone Encounter (Signed)
Called patient to schedule follow up with Dr. Etter Sjogren per below. Patient has a VM that has not been set up.

## 2015-12-11 NOTE — Telephone Encounter (Signed)
Rx faxed, I tried to call and make the patient aware but his VM was not setup to leave a message. If he calls please make him aware the Rx has been sent.    KP

## 2015-12-11 NOTE — Telephone Encounter (Signed)
Atorvastatin refill sent to pharmacy. Pt is due for 6 month follow up with Dr Etter Sjogren on 12/17/15.  Please call pt to arrange appt.  Thanks!

## 2015-12-12 NOTE — Telephone Encounter (Signed)
My chart message sent to pt.

## 2016-01-01 ENCOUNTER — Encounter: Payer: Self-pay | Admitting: *Deleted

## 2016-02-28 LAB — LIPID PANEL
CHOLESTEROL: 144 mg/dL (ref 0–200)
HDL: 39 mg/dL (ref 35–70)
LDL CALC: 58 mg/dL
Triglycerides: 235 mg/dL — AB (ref 40–160)

## 2016-02-28 LAB — HEMOGLOBIN A1C: HEMOGLOBIN A1C: 5.5

## 2016-02-28 LAB — BASIC METABOLIC PANEL: Glucose: 115 mg/dL

## 2016-03-04 ENCOUNTER — Telehealth: Payer: Self-pay | Admitting: Family Medicine

## 2016-03-04 NOTE — Telephone Encounter (Signed)
Lipid and A1c quick abstracted, results placed in the red folder.     KP

## 2016-03-04 NOTE — Telephone Encounter (Signed)
Pt dropped off document to have information to be put on his chart and for PCP to see. [facility] [employer] (Health Screening Form)

## 2016-03-07 NOTE — Telephone Encounter (Signed)
tg better----  Fish oil/ flaxseed oil can help 4g a day---- Recheck lipid, cmp 6 months

## 2016-03-07 NOTE — Telephone Encounter (Signed)
My-chart message sent      KP 

## 2016-03-11 ENCOUNTER — Other Ambulatory Visit: Payer: Self-pay | Admitting: Family Medicine

## 2016-04-13 ENCOUNTER — Other Ambulatory Visit: Payer: Self-pay | Admitting: Family Medicine

## 2016-05-08 ENCOUNTER — Telehealth: Payer: Self-pay | Admitting: Family Medicine

## 2016-05-08 NOTE — Telephone Encounter (Signed)
Sent patient message on an Active My Chart informing that the appointment with Dr. Carollee Herter scheduled for 06/23/2016 @ 1:30 PM has been cancelled. Instructed patient to call the office to reschedule.

## 2016-06-12 NOTE — Telephone Encounter (Signed)
Attempted to contact patient to reschedule appointment with Dr. Carollee Herter 10/9. Patient has a voicemail that has not been set up. Could not leave message.

## 2016-06-23 ENCOUNTER — Encounter: Payer: Self-pay | Admitting: Family Medicine

## 2016-09-22 ENCOUNTER — Encounter: Payer: Self-pay | Admitting: Family Medicine

## 2016-09-22 ENCOUNTER — Ambulatory Visit (INDEPENDENT_AMBULATORY_CARE_PROVIDER_SITE_OTHER): Payer: 59 | Admitting: Family Medicine

## 2016-09-22 VITALS — BP 120/82 | HR 75 | Temp 98.7°F | Resp 16 | Ht 70.0 in | Wt 168.8 lb

## 2016-09-22 DIAGNOSIS — R319 Hematuria, unspecified: Secondary | ICD-10-CM

## 2016-09-22 DIAGNOSIS — Z0000 Encounter for general adult medical examination without abnormal findings: Secondary | ICD-10-CM

## 2016-09-22 DIAGNOSIS — F17200 Nicotine dependence, unspecified, uncomplicated: Secondary | ICD-10-CM

## 2016-09-22 DIAGNOSIS — E785 Hyperlipidemia, unspecified: Secondary | ICD-10-CM

## 2016-09-22 DIAGNOSIS — R8290 Unspecified abnormal findings in urine: Secondary | ICD-10-CM | POA: Diagnosis not present

## 2016-09-22 LAB — CBC WITH DIFFERENTIAL/PLATELET
BASOS ABS: 0 10*3/uL (ref 0.0–0.1)
Basophils Relative: 0.5 % (ref 0.0–3.0)
EOS ABS: 0.3 10*3/uL (ref 0.0–0.7)
Eosinophils Relative: 3.7 % (ref 0.0–5.0)
HCT: 46.9 % (ref 39.0–52.0)
Hemoglobin: 16.5 g/dL (ref 13.0–17.0)
LYMPHS ABS: 2.2 10*3/uL (ref 0.7–4.0)
Lymphocytes Relative: 31.2 % (ref 12.0–46.0)
MCHC: 35.1 g/dL (ref 30.0–36.0)
MCV: 91.2 fl (ref 78.0–100.0)
Monocytes Absolute: 0.6 10*3/uL (ref 0.1–1.0)
Monocytes Relative: 7.8 % (ref 3.0–12.0)
NEUTROS ABS: 4 10*3/uL (ref 1.4–7.7)
NEUTROS PCT: 56.8 % (ref 43.0–77.0)
PLATELETS: 301 10*3/uL (ref 150.0–400.0)
RBC: 5.14 Mil/uL (ref 4.22–5.81)
RDW: 13.1 % (ref 11.5–15.5)
WBC: 7.1 10*3/uL (ref 4.0–10.5)

## 2016-09-22 LAB — POC URINALSYSI DIPSTICK (AUTOMATED)
BILIRUBIN UA: NEGATIVE
Glucose, UA: NEGATIVE
Ketones, UA: NEGATIVE
LEUKOCYTES UA: NEGATIVE
NITRITE UA: NEGATIVE
PH UA: 6
PROTEIN UA: NEGATIVE
Spec Grav, UA: >=1.03
UROBILINOGEN UA: 0.2

## 2016-09-22 LAB — COMPREHENSIVE METABOLIC PANEL
ALT: 20 U/L (ref 0–53)
AST: 19 U/L (ref 0–37)
Albumin: 5.2 g/dL (ref 3.5–5.2)
Alkaline Phosphatase: 81 U/L (ref 39–117)
BILIRUBIN TOTAL: 0.7 mg/dL (ref 0.2–1.2)
BUN: 16 mg/dL (ref 6–23)
CO2: 31 meq/L (ref 19–32)
CREATININE: 1.01 mg/dL (ref 0.40–1.50)
Calcium: 10.9 mg/dL — ABNORMAL HIGH (ref 8.4–10.5)
Chloride: 101 mEq/L (ref 96–112)
GFR: 81.75 mL/min (ref >60.00)
GLUCOSE: 104 mg/dL — AB (ref 70–99)
Potassium: 5.5 mEq/L — ABNORMAL HIGH (ref 3.5–5.1)
Sodium: 143 mEq/L (ref 135–145)
TOTAL PROTEIN: 7.8 g/dL (ref 6.0–8.3)

## 2016-09-22 LAB — TSH: TSH: 0.85 u[IU]/mL (ref 0.35–4.50)

## 2016-09-22 LAB — LIPID PANEL
CHOL/HDL RATIO: 5
CHOLESTEROL: 224 mg/dL — AB (ref 0–200)
HDL: 48.7 mg/dL (ref >39.00)
NONHDL: 175.7
Triglycerides: 258 mg/dL — ABNORMAL HIGH (ref 0.0–149.0)
VLDL: 51.6 mg/dL — ABNORMAL HIGH (ref 0.0–40.0)

## 2016-09-22 LAB — LDL CHOLESTEROL, DIRECT: LDL DIRECT: 123 mg/dL

## 2016-09-22 LAB — PSA: PSA: 1.14 ng/mL (ref 0.10–4.00)

## 2016-09-22 MED ORDER — EPINEPHRINE 0.3 MG/0.3ML IJ SOAJ: 0.3000 mg | Freq: Once | INTRAMUSCULAR | 5 refills | Status: AC

## 2016-09-22 MED ORDER — VARENICLINE TARTRATE 0.5 MG X 11 & 1 MG X 42 PO MISC: ORAL | 0 refills | Status: DC

## 2016-09-22 MED ORDER — VARENICLINE TARTRATE 1 MG PO TABS: 1.0000 mg | ORAL_TABLET | Freq: Two times a day (BID) | ORAL | 1 refills | Status: DC

## 2016-09-22 NOTE — Progress Notes (Signed)
Pre visit review using our clinic review tool, if applicable. No additional management support is needed unless otherwise documented below in the visit note. 
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
## 2016-09-22 NOTE — Assessment & Plan Note (Signed)
See AVS ghm utd Check labs 

## 2016-09-22 NOTE — Patient Instructions (Signed)
Preventive Care 40-64 Years, Male Preventive care refers to lifestyle choices and visits with your health care provider that can promote health and wellness. What does preventive care include?  A yearly physical exam. This is also called an annual well check.  Dental exams once or twice a year.  Routine eye exams. Ask your health care provider how often you should have your eyes checked.  Personal lifestyle choices, including:  Daily care of your teeth and gums.  Regular physical activity.  Eating a healthy diet.  Avoiding tobacco and drug use.  Limiting alcohol use.  Practicing safe sex.  Taking low-dose aspirin every day starting at age 50. What happens during an annual well check? The services and screenings done by your health care provider during your annual well check will depend on your age, overall health, lifestyle risk factors, and family history of disease. Counseling  Your health care provider may ask you questions about your:  Alcohol use.  Tobacco use.  Drug use.  Emotional well-being.  Home and relationship well-being.  Sexual activity.  Eating habits.  Work and work environment. Screening  You may have the following tests or measurements:  Height, weight, and BMI.  Blood pressure.  Lipid and cholesterol levels. These may be checked every 5 years, or more frequently if you are over 50 years old.  Skin check.  Lung cancer screening. You may have this screening every year starting at age 55 if you have a 30-pack-year history of smoking and currently smoke or have quit within the past 15 years.  Fecal occult blood test (FOBT) of the stool. You may have this test every year starting at age 50.  Flexible sigmoidoscopy or colonoscopy. You may have a sigmoidoscopy every 5 years or a colonoscopy every 10 years starting at age 50.  Prostate cancer screening. Recommendations will vary depending on your family history and other risks.  Hepatitis C  blood test.  Hepatitis B blood test.  Sexually transmitted disease (STD) testing.  Diabetes screening. This is done by checking your blood sugar (glucose) after you have not eaten for a while (fasting). You may have this done every 1-3 years. Discuss your test results, treatment options, and if necessary, the need for more tests with your health care provider. Vaccines  Your health care provider may recommend certain vaccines, such as:  Influenza vaccine. This is recommended every year.  Tetanus, diphtheria, and acellular pertussis (Tdap, Td) vaccine. You may need a Td booster every 10 years.  Varicella vaccine. You may need this if you have not been vaccinated.  Zoster vaccine. You may need this after age 60.  Measles, mumps, and rubella (MMR) vaccine. You may need at least one dose of MMR if you were born in 1957 or later. You may also need a second dose.  Pneumococcal 13-valent conjugate (PCV13) vaccine. You may need this if you have certain conditions and have not been vaccinated.  Pneumococcal polysaccharide (PPSV23) vaccine. You may need one or two doses if you smoke cigarettes or if you have certain conditions.  Meningococcal vaccine. You may need this if you have certain conditions.  Hepatitis A vaccine. You may need this if you have certain conditions or if you travel or work in places where you may be exposed to hepatitis A.  Hepatitis B vaccine. You may need this if you have certain conditions or if you travel or work in places where you may be exposed to hepatitis B.  Haemophilus influenzae type b (Hib)   vaccine. You may need this if you have certain risk factors. Talk to your health care provider about which screenings and vaccines you need and how often you need them. This information is not intended to replace advice given to you by your health care provider. Make sure you discuss any questions you have with your health care provider. Document Released: 09/28/2015  Document Revised: 05/21/2016 Document Reviewed: 07/03/2015 Elsevier Interactive Patient Education  2017 [employer].

## 2016-09-23 ENCOUNTER — Telehealth: Payer: Self-pay | Admitting: *Deleted

## 2016-09-23 LAB — URINE CULTURE: ORGANISM ID, BACTERIA: NO GROWTH

## 2016-09-23 NOTE — Telephone Encounter (Signed)
Prior auth submitted for Chantix through cover my meds.  Awaiting approval.

## 2016-09-23 NOTE — Addendum Note (Signed)
Addended by: Roma Schanz R on: 09/23/2016 08:52 AM   Modules accepted: Orders

## 2016-09-24 ENCOUNTER — Encounter: Payer: Self-pay | Admitting: Gastroenterology

## 2016-09-29 ENCOUNTER — Other Ambulatory Visit: Payer: Self-pay | Admitting: Family Medicine

## 2016-09-29 DIAGNOSIS — E785 Hyperlipidemia, unspecified: Secondary | ICD-10-CM

## 2016-09-29 MED ORDER — ATORVASTATIN CALCIUM 40 MG PO TABS: 40.0000 mg | ORAL_TABLET | Freq: Every day | ORAL | 3 refills | Status: DC

## 2016-09-30 NOTE — Telephone Encounter (Signed)
Checked the status for the Chantix continuing pack and it is still in review and it could take several days.

## 2016-10-03 ENCOUNTER — Other Ambulatory Visit: Payer: Self-pay

## 2016-10-03 MED ORDER — GEMFIBROZIL 600 MG PO TABS: ORAL_TABLET | ORAL | 5 refills | Status: DC

## 2016-11-03 ENCOUNTER — Other Ambulatory Visit (INDEPENDENT_AMBULATORY_CARE_PROVIDER_SITE_OTHER): Payer: 59

## 2016-11-03 DIAGNOSIS — E785 Hyperlipidemia, unspecified: Secondary | ICD-10-CM | POA: Diagnosis not present

## 2016-11-03 LAB — COMPREHENSIVE METABOLIC PANEL
ALBUMIN: 4.8 g/dL (ref 3.5–5.2)
ALK PHOS: 70 U/L (ref 39–117)
ALT: 19 U/L (ref 0–53)
AST: 20 U/L (ref 0–37)
BUN: 12 mg/dL (ref 6–23)
CO2: 27 mEq/L (ref 19–32)
Calcium: 9.5 mg/dL (ref 8.4–10.5)
Chloride: 107 mEq/L (ref 96–112)
Creatinine, Ser: 0.89 mg/dL (ref 0.40–1.50)
GFR: 94.55 mL/min (ref >60.00)
Glucose, Bld: 101 mg/dL — ABNORMAL HIGH (ref 70–99)
POTASSIUM: 4.3 meq/L (ref 3.5–5.1)
SODIUM: 140 meq/L (ref 135–145)
TOTAL PROTEIN: 7.2 g/dL (ref 6.0–8.3)
Total Bilirubin: 0.5 mg/dL (ref 0.2–1.2)

## 2016-11-03 LAB — LIPID PANEL
Cholesterol: 130 mg/dL (ref 0–200)
HDL: 40 mg/dL (ref >39.00)
NonHDL: 90.21
TRIGLYCERIDES: 257 mg/dL — AB (ref 0.0–149.0)
Total CHOL/HDL Ratio: 3
VLDL: 51.4 mg/dL — ABNORMAL HIGH (ref 0.0–40.0)

## 2016-11-03 LAB — LDL CHOLESTEROL, DIRECT: Direct LDL: 57 mg/dL

## 2016-11-07 ENCOUNTER — Telehealth: Payer: Self-pay | Admitting: *Deleted

## 2016-11-07 ENCOUNTER — Encounter: Payer: Self-pay | Admitting: Gastroenterology

## 2016-11-07 ENCOUNTER — Ambulatory Visit (AMBULATORY_SURGERY_CENTER): Payer: Self-pay | Admitting: *Deleted

## 2016-11-07 VITALS — Ht 70.0 in | Wt 169.0 lb

## 2016-11-07 DIAGNOSIS — Z860100 Personal history of colon polyps, unspecified: Secondary | ICD-10-CM

## 2016-11-07 DIAGNOSIS — Z800 Family history of malignant neoplasm of digestive organs: Secondary | ICD-10-CM

## 2016-11-07 MED ORDER — NA SULFATE-K SULFATE-MG SULF 17.5-3.13-1.6 GM/177ML PO SOLN: ORAL | 0 refills | Status: DC

## 2016-11-07 NOTE — Telephone Encounter (Signed)
Reviewed the report and images, ok to schedule at [facility].

## 2016-11-07 NOTE — Telephone Encounter (Signed)
Dr.Nandigam, please review previous colon report from 36. Dr.Kaplan stated recall colon 1 year with ERBE. Should patient be done at [facility] w/[facility]BE or [place]?

## 2016-11-07 NOTE — Telephone Encounter (Signed)
See note from Dr. Silverio Decamp below that pt may be scheduled in the [place].

## 2016-11-07 NOTE — Progress Notes (Signed)
Patient denies any allergies to eggs or soy. Patient denies any problems with anesthesia/sedation. Patient denies any oxygen use at home and does not take any diet/weight loss medications. EMMI education declined by patient.  

## 2016-11-07 NOTE — Telephone Encounter (Signed)
Notified patient.

## 2016-11-14 ENCOUNTER — Other Ambulatory Visit: Payer: Self-pay | Admitting: Family Medicine

## 2016-11-14 DIAGNOSIS — E785 Hyperlipidemia, unspecified: Secondary | ICD-10-CM

## 2016-11-21 ENCOUNTER — Encounter: Payer: Self-pay | Admitting: Gastroenterology

## 2016-11-21 ENCOUNTER — Ambulatory Visit (AMBULATORY_SURGERY_CENTER): Payer: 59 | Admitting: Gastroenterology

## 2016-11-21 VITALS — BP 129/88 | HR 70 | Temp 98.4°F | Resp 13 | Ht 70.0 in | Wt 168.0 lb

## 2016-11-21 DIAGNOSIS — Z86010 Personal history of colonic polyps: Secondary | ICD-10-CM

## 2016-11-21 DIAGNOSIS — D125 Benign neoplasm of sigmoid colon: Secondary | ICD-10-CM | POA: Diagnosis not present

## 2016-11-21 DIAGNOSIS — K635 Polyp of colon: Secondary | ICD-10-CM

## 2016-11-21 DIAGNOSIS — D123 Benign neoplasm of transverse colon: Secondary | ICD-10-CM

## 2016-11-21 DIAGNOSIS — D124 Benign neoplasm of descending colon: Secondary | ICD-10-CM

## 2016-11-21 DIAGNOSIS — D122 Benign neoplasm of ascending colon: Secondary | ICD-10-CM | POA: Diagnosis not present

## 2016-11-21 MED ORDER — SODIUM CHLORIDE 0.9 % IV SOLN: 500.0000 mL | INTRAVENOUS | Status: DC

## 2016-11-21 NOTE — Patient Instructions (Signed)
Handout given for polyps   YOU HAD AN ENDOSCOPIC PROCEDURE TODAY: Refer to the procedure report and other information in the discharge instructions given to you for any specific questions about what was found during the examination. If this information does not answer your questions, please call [place] office at [phone] to clarify.   YOU SHOULD EXPECT: Some feelings of bloating in the abdomen. Passage of more gas than usual. Walking can help get rid of the air that was put into your GI tract during the procedure and reduce the bloating. If you had a lower endoscopy (such as a colonoscopy or flexible sigmoidoscopy) you may notice spotting of blood in your stool or on the toilet paper. Some abdominal soreness may be present for a day or two, also.  DIET: Your first meal following the procedure should be a light meal and then it is ok to progress to your normal diet. A half-sandwich or bowl of soup is an example of a good first meal. Heavy or fried foods are harder to digest and may make you feel nauseous or bloated. Drink plenty of fluids but you should avoid alcoholic beverages for 24 hours. If you had a esophageal dilation, please see attached instructions for diet.    ACTIVITY: Your care partner should take you home directly after the procedure. You should plan to take it easy, moving slowly for the rest of the day. You can resume normal activity the day after the procedure however YOU SHOULD NOT DRIVE, use power tools, machinery or perform tasks that involve climbing or major physical exertion for 24 hours (because of the sedation medicines used during the test).   SYMPTOMS TO REPORT IMMEDIATELY: A gastroenterologist can be reached at any hour. Please call [phone]  for any of the following symptoms:  Following lower endoscopy (colonoscopy, flexible sigmoidoscopy) Excessive amounts of blood in the stool  Significant tenderness, worsening of abdominal pains  Swelling of the abdomen that is  new, acute  Fever of 100 or higher   FOLLOW UP:  If any biopsies were taken you will be contacted by phone or by letter within the next 1-3 weeks. Call [phone]  if you have not heard about the biopsies in 3 weeks.  Please also call with any specific questions about appointments or follow up tests.

## 2016-11-21 NOTE — Op Note (Signed)
[facility] Patient Name: Randy [name] Procedure Date: 11/21/2016 8:05 AM MRN: [id number] Endoscopist: Mauri Pole , MD Age: 56 Referring MD:  Date of Birth: Sep 04, 1961 Gender: Male Account #: [id number] Procedure:                Colonoscopy Indications:              High risk colon cancer surveillance: Personal                            history of adenoma (10 mm or greater in size) Medicines:                Monitored Anesthesia Care Procedure:                Pre-Anesthesia Assessment:                           - Prior to the procedure, a History and Physical                            was performed, and patient medications and                            allergies were reviewed. The patient's tolerance of                            previous anesthesia was also reviewed. The risks                            and benefits of the procedure and the sedation                            options and risks were discussed with the patient.                            All questions were answered, and informed consent                            was obtained. Prior Anticoagulants: The patient has                            taken no previous anticoagulant or antiplatelet                            agents. ASA Grade Assessment: II - A patient with                            mild systemic disease. After reviewing the risks                            and benefits, the patient was deemed in                            satisfactory condition to undergo the procedure.  After obtaining informed consent, the colonoscope                            was passed under direct vision. Throughout the                            procedure, the patient's blood pressure, pulse, and                            oxygen saturations were monitored continuously. The                            Model CF-HQ190L [id number]) scope was introduced                            through the anus  and advanced to the the terminal                            ileum, with identification of the appendiceal                            orifice and IC valve. The colonoscopy was performed                            without difficulty. The patient tolerated the                            procedure well. The quality of the bowel                            preparation was adequate. The terminal ileum,                            ileocecal valve, appendiceal orifice, and rectum                            were photographed. Scope In: 2:97:98 AM Scope Out: 8:38:50 AM Scope Withdrawal Time: 0 hours 20 minutes 44 seconds  Total Procedure Duration: 0 hours 32 minutes 6 seconds  Findings:                 The perianal and digital rectal examinations were                            normal.                           Four sessile polyps were found in the descending                            colon, transverse colon and ascending colon. The                            polyps were 4 to 9 mm in size. These polyps were  removed with a cold snare. Resection and retrieval                            were complete. Brisk oozing noted at one of the                            sites of polypectomy treated with cautery with good                            hemostasis.                           Noted area of spot tattoo in ascending colon                           Two sessile polyps were found in the sigmoid colon                            and transverse colon. The polyps were 2 to 4 mm in                            size. These polyps were removed with a cold biopsy                            forceps. Resection and retrieval were complete.                           Non-bleeding internal hemorrhoids were found during                            retroflexion. The hemorrhoids were small. Complications:            No immediate complications. Estimated Blood Loss:     Estimated blood loss was  minimal. Impression:               - Four 4 to 9 mm polyps in the descending colon, in                            the transverse colon and in the ascending colon,                            removed with a cold snare. Resected and retrieved.                           - Two 2 to 4 mm polyps in the sigmoid colon and in                            the transverse colon, removed with a cold biopsy                            forceps. Resected and retrieved.                           -  Non-bleeding internal hemorrhoids. Recommendation:           - Patient has a contact number available for                            emergencies. The signs and symptoms of potential                            delayed complications were discussed with the                            patient. Return to normal activities tomorrow.                            Written discharge instructions were provided to the                            patient.                           - Resume previous diet.                           - Continue present medications.                           - Await pathology results.                           - Repeat colonoscopy in 3 years for surveillance                            based on pathology results. Mauri Pole, MD 11/21/2016 8:48:38 AM This report has been signed electronically.

## 2016-11-21 NOTE — Progress Notes (Signed)
Pt to PACU VSS, alert, report to [employer].

## 2016-11-21 NOTE — Progress Notes (Signed)
Called to room to assist during endoscopic procedure.  Patient ID and intended procedure confirmed with present staff. Received instructions for my participation in the procedure from the performing physician.  

## 2016-11-24 ENCOUNTER — Telehealth: Payer: Self-pay

## 2016-11-24 NOTE — Telephone Encounter (Signed)
Left message

## 2016-11-25 ENCOUNTER — Telehealth: Payer: Self-pay | Admitting: *Deleted

## 2016-11-25 NOTE — Telephone Encounter (Signed)
  Follow up Call-  Call back number 11/21/2016  Post procedure Call Back phone  # [phone]  Permission to leave phone message Yes  Some recent data might be hidden    [facility]

## 2016-11-26 ENCOUNTER — Encounter: Payer: Self-pay | Admitting: Gastroenterology

## 2016-12-29 ENCOUNTER — Other Ambulatory Visit: Payer: Self-pay | Admitting: Family Medicine

## 2016-12-29 MED ORDER — ATORVASTATIN CALCIUM 40 MG PO TABS: 40.0000 mg | ORAL_TABLET | Freq: Every day | ORAL | 3 refills | Status: DC

## 2017-02-16 ENCOUNTER — Other Ambulatory Visit (INDEPENDENT_AMBULATORY_CARE_PROVIDER_SITE_OTHER): Payer: 59

## 2017-02-16 DIAGNOSIS — E785 Hyperlipidemia, unspecified: Secondary | ICD-10-CM | POA: Diagnosis not present

## 2017-02-16 LAB — COMPREHENSIVE METABOLIC PANEL
ALBUMIN: 4.8 g/dL (ref 3.5–5.2)
ALK PHOS: 79 U/L (ref 39–117)
ALT: 24 U/L (ref 0–53)
AST: 25 U/L (ref 0–37)
BILIRUBIN TOTAL: 0.6 mg/dL (ref 0.2–1.2)
BUN: 13 mg/dL (ref 6–23)
CO2: 26 mEq/L (ref 19–32)
CREATININE: 0.9 mg/dL (ref 0.40–1.50)
Calcium: 9.6 mg/dL (ref 8.4–10.5)
Chloride: 104 mEq/L (ref 96–112)
GFR: 93.24 mL/min (ref >60.00)
Glucose, Bld: 106 mg/dL — ABNORMAL HIGH (ref 70–99)
Potassium: 4.4 mEq/L (ref 3.5–5.1)
Sodium: 138 mEq/L (ref 135–145)
TOTAL PROTEIN: 7.3 g/dL (ref 6.0–8.3)

## 2017-02-16 LAB — LIPID PANEL
CHOLESTEROL: 136 mg/dL (ref 0–200)
HDL: 43.1 mg/dL (ref >39.00)
LDL Cholesterol: 58 mg/dL (ref 0–99)
NonHDL: 92.94
Total CHOL/HDL Ratio: 3
Triglycerides: 176 mg/dL — ABNORMAL HIGH (ref 0.0–149.0)
VLDL: 35.2 mg/dL (ref 0.0–40.0)

## 2017-07-22 ENCOUNTER — Other Ambulatory Visit: Payer: Self-pay | Admitting: Family Medicine

## 2017-10-30 ENCOUNTER — Encounter: Payer: Self-pay | Admitting: *Deleted

## 2017-10-30 ENCOUNTER — Ambulatory Visit
Admission: EM | Admit: 2017-10-30 | Discharge: 2017-10-30 | Disposition: A | Discharge status: Home / Self Care | Payer: BLUE CROSS/BLUE SHIELD | Attending: Family Medicine | Admitting: Family Medicine

## 2017-10-30 DIAGNOSIS — S70361A Insect bite (nonvenomous), right thigh, initial encounter: Secondary | ICD-10-CM

## 2017-10-30 DIAGNOSIS — R21 Rash and other nonspecific skin eruption: Secondary | ICD-10-CM

## 2017-10-30 DIAGNOSIS — W57XXXA Bitten or stung by nonvenomous insect and other nonvenomous arthropods, initial encounter: Secondary | ICD-10-CM | POA: Diagnosis not present

## 2017-10-30 MED ORDER — SULFAMETHOXAZOLE-TRIMETHOPRIM 800-160 MG PO TABS: 1.0000 | ORAL_TABLET | Freq: Two times a day (BID) | ORAL | 0 refills | Status: DC
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
## 2017-10-30 NOTE — ED Triage Notes (Signed)
Rash to posterior aspect of right thigh x1 week. Intense itching.

## 2017-10-30 NOTE — Discharge Instructions (Signed)
Follow up if symptoms worse

## 2019-10-04 ENCOUNTER — Encounter: Payer: Self-pay | Admitting: Gastroenterology

## 2020-04-14 ENCOUNTER — Other Ambulatory Visit: Payer: Self-pay

## 2020-04-14 ENCOUNTER — Encounter: Payer: Self-pay | Admitting: Emergency Medicine

## 2020-04-14 ENCOUNTER — Ambulatory Visit
Admission: EM | Admit: 2020-04-14 | Discharge: 2020-04-14 | Disposition: A | Discharge status: Home / Self Care | Payer: BC Managed Care – PPO | Attending: Family Medicine | Admitting: Family Medicine

## 2020-04-14 DIAGNOSIS — M545 Low back pain, unspecified: Secondary | ICD-10-CM

## 2020-04-14 DIAGNOSIS — M62838 Other muscle spasm: Secondary | ICD-10-CM

## 2020-04-14 MED ORDER — TIZANIDINE HCL 4 MG PO TABS: 4.0000 mg | ORAL_TABLET | Freq: Three times a day (TID) | ORAL | 0 refills | Status: DC | PRN

## 2020-04-14 MED ORDER — MELOXICAM 15 MG PO TABS: 15.0000 mg | ORAL_TABLET | Freq: Every day | ORAL | 0 refills | Status: DC | PRN
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
## 2020-04-14 NOTE — Discharge Instructions (Signed)
Heat.  Medication as directed.  Take care  Dr. Linzie Criss  
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
## 2020-05-05 ENCOUNTER — Other Ambulatory Visit: Payer: Self-pay | Admitting: Family Medicine

## 2020-05-07 ENCOUNTER — Ambulatory Visit
Admission: EM | Admit: 2020-05-07 | Discharge: 2020-05-07 | Disposition: A | Discharge status: Home / Self Care | Payer: BC Managed Care – PPO | Attending: Internal Medicine | Admitting: Internal Medicine

## 2020-05-07 ENCOUNTER — Other Ambulatory Visit: Payer: Self-pay

## 2020-05-07 DIAGNOSIS — S43421A Sprain of right rotator cuff capsule, initial encounter: Secondary | ICD-10-CM | POA: Diagnosis not present

## 2020-05-07 MED ORDER — IBUPROFEN 600 MG PO TABS: 600.0000 mg | ORAL_TABLET | Freq: Four times a day (QID) | ORAL | 0 refills | Status: AC | PRN

## 2020-05-07 NOTE — Discharge Instructions (Signed)
Gentle range of motion exercises Continue NSAIDs Make an appointment with orthopedic surgery to be seen Heating pad-10 minutes on/15 minutes of 4 cycle 2 times a day.
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
## 2020-05-15 DIAGNOSIS — M25511 Pain in right shoulder: Secondary | ICD-10-CM | POA: Diagnosis not present

## 2020-05-28 DIAGNOSIS — M25511 Pain in right shoulder: Secondary | ICD-10-CM | POA: Diagnosis not present

## 2021-04-15 ENCOUNTER — Telehealth: Payer: Self-pay | Admitting: Gastroenterology

## 2021-04-16 ENCOUNTER — Other Ambulatory Visit: Payer: Self-pay

## 2021-04-16 DIAGNOSIS — R1084 Generalized abdominal pain: Secondary | ICD-10-CM

## 2021-04-16 DIAGNOSIS — R197 Diarrhea, unspecified: Secondary | ICD-10-CM

## 2021-04-16 NOTE — Telephone Encounter (Signed)
Can we do any lab? His appointment is 05/14/21. He has never been seen in the office. He has had his colonoscopy through [facility].  No PCP.

## 2021-04-16 NOTE — Telephone Encounter (Signed)
Please check GI pathogen panel, CBC and BMP.  Advised patient to maintain hydration, drink sips of fluids as tolerated.  He will need to come to ER if he continues to have significant abdominal pain or worsening diarrhea with inability to tolerate any p.o. intake.  Thanks

## 2021-04-16 NOTE — Telephone Encounter (Signed)
Called the patient back. Left the plan of care on his voicemail. Orders put in Epic for the labs.

## 2021-04-16 NOTE — Telephone Encounter (Signed)
Pt returned your call. He stated to have been dealing with diarrhea and abd pain on and off for about one month. He said that he began taking Omega excel and Turmeric and sxs seemed to subside. However, last Friday night he had 4 bms in a spam of 10 hours. Pt has been taking imodium 30 mg since last Saturday morning, it has helped a little bit but stools are still not formed and he is still experiencing abd pain and diarrhea after eating. Abd pain gets worse with bms. Pls call hi. It is ok to leave a detailed message if he doe snot answer the phone as he is going to work at this moment.

## 2021-04-16 NOTE — Telephone Encounter (Signed)
Called the patient back to discuss. No answer. Left a voicemail of my call.

## 2021-04-17 NOTE — Telephone Encounter (Signed)
Left a message for the patient to confirm he was aware of the need for labs.

## 2021-04-18 ENCOUNTER — Other Ambulatory Visit (INDEPENDENT_AMBULATORY_CARE_PROVIDER_SITE_OTHER): Payer: BC Managed Care – PPO

## 2021-04-18 DIAGNOSIS — A048 Other specified bacterial intestinal infections: Secondary | ICD-10-CM | POA: Diagnosis not present

## 2021-04-18 DIAGNOSIS — R1084 Generalized abdominal pain: Secondary | ICD-10-CM | POA: Diagnosis not present

## 2021-04-18 DIAGNOSIS — R197 Diarrhea, unspecified: Secondary | ICD-10-CM | POA: Diagnosis not present

## 2021-04-18 LAB — CBC WITH DIFFERENTIAL/PLATELET
Basophils Absolute: 0 10*3/uL (ref 0.0–0.1)
Basophils Relative: 0.7 % (ref 0.0–3.0)
Eosinophils Absolute: 0.1 10*3/uL (ref 0.0–0.7)
Eosinophils Relative: 2.9 % (ref 0.0–5.0)
HCT: 46.3 % (ref 39.0–52.0)
Hemoglobin: 15.7 g/dL (ref 13.0–17.0)
Lymphocytes Relative: 32.6 % (ref 12.0–46.0)
Lymphs Abs: 1.5 10*3/uL (ref 0.7–4.0)
MCHC: 34 g/dL (ref 30.0–36.0)
MCV: 92.2 fl (ref 78.0–100.0)
Monocytes Absolute: 0.4 10*3/uL (ref 0.1–1.0)
Monocytes Relative: 7.9 % (ref 3.0–12.0)
Neutro Abs: 2.6 10*3/uL (ref 1.4–7.7)
Neutrophils Relative %: 55.9 % (ref 43.0–77.0)
Platelets: 254 10*3/uL (ref 150.0–400.0)
RBC: 5.02 Mil/uL (ref 4.22–5.81)
RDW: 13 % (ref 11.5–15.5)
WBC: 4.7 10*3/uL (ref 4.0–10.5)

## 2021-04-18 LAB — COMPREHENSIVE METABOLIC PANEL
ALT: 23 U/L (ref 0–53)
AST: 20 U/L (ref 0–37)
Albumin: 4.5 g/dL (ref 3.5–5.2)
Alkaline Phosphatase: 65 U/L (ref 39–117)
BUN: 13 mg/dL (ref 6–23)
CO2: 27 mEq/L (ref 19–32)
Calcium: 9.5 mg/dL (ref 8.4–10.5)
Chloride: 101 mEq/L (ref 96–112)
Creatinine, Ser: 0.96 mg/dL (ref 0.40–1.50)
GFR: 86.95 mL/min (ref >60.00)
Glucose, Bld: 178 mg/dL — ABNORMAL HIGH (ref 70–99)
Potassium: 3.8 mEq/L (ref 3.5–5.1)
Sodium: 137 mEq/L (ref 135–145)
Total Bilirubin: 0.6 mg/dL (ref 0.2–1.2)
Total Protein: 7.1 g/dL (ref 6.0–8.3)

## 2021-04-18 NOTE — Telephone Encounter (Signed)
Pt called to inform you that he is on his way here to do labwork.

## 2021-04-22 LAB — GI PROFILE, STOOL, PCR

## 2021-05-14 ENCOUNTER — Other Ambulatory Visit (INDEPENDENT_AMBULATORY_CARE_PROVIDER_SITE_OTHER): Payer: BC Managed Care – PPO

## 2021-05-14 ENCOUNTER — Ambulatory Visit (INDEPENDENT_AMBULATORY_CARE_PROVIDER_SITE_OTHER): Payer: BC Managed Care – PPO | Admitting: Physician Assistant

## 2021-05-14 ENCOUNTER — Encounter: Payer: Self-pay | Admitting: Physician Assistant

## 2021-05-14 VITALS — BP 118/78 | HR 76 | Ht 69.0 in | Wt 158.2 lb

## 2021-05-14 DIAGNOSIS — R634 Abnormal weight loss: Secondary | ICD-10-CM

## 2021-05-14 DIAGNOSIS — Z86010 Personal history of colonic polyps: Secondary | ICD-10-CM | POA: Diagnosis not present

## 2021-05-14 DIAGNOSIS — R197 Diarrhea, unspecified: Secondary | ICD-10-CM

## 2021-05-14 LAB — SEDIMENTATION RATE: Sed Rate: 3 mm/hr (ref 0–20)

## 2021-05-14 LAB — C-REACTIVE PROTEIN: CRP: <1 mg/dL (ref 0.5–20.0)

## 2021-05-14 MED ORDER — NA SULFATE-K SULFATE-MG SULF 17.5-3.13-1.6 GM/177ML PO SOLN: 1.0000 | Freq: Once | ORAL | 0 refills | Status: AC

## 2021-05-14 NOTE — Patient Instructions (Signed)
If you are age 59 or younger, your body mass index should be between 19-25. Your Body mass index is 23.36 kg/m. If this is out of the aformentioned range listed, please consider follow up with your Primary Care Provider.  __________________________________________________________  The [facility] providers would like to encourage you to use [facility] to communicate with providers for non-urgent requests or questions.  Due to long hold times on the telephone, sending your provider a message by [facility] may be a faster and more efficient way to get a response.  Please allow 48 business hours for a response.  Please remember that this is for non-urgent requests.   You have been scheduled for a colonoscopy. Please follow written instructions given to you at your visit today.  Please pick up your prep supplies at the pharmacy within the next 1-3 days. If you use inhalers (even only as needed), please bring them with you on the day of your procedure.  Your provider has requested that you go to the basement level for lab work before leaving today. Press "B" on the elevator. The lab is located at the first door on the left as you exit the elevator.  Continue Imodium 1 tablet twice daily as needed.  Follow up pending the results of your Labs and Colonoscopy.  Thank you for entrusting me with your care and choosing [facility].  Amy Esterwood, PA-C

## 2021-05-14 NOTE — Progress Notes (Signed)
Subjective:    Patient ID: Randy [name], male    DOB: May 20, 1961, 60 y.o.   MRN: [id number]  HPI Randy [name] is a pleasant 60 year old white male established with Dr. Silverio Decamp who comes in today with complaints of diarrhea and abdominal pain. Patient says he initially had onset of the symptoms around February 2022 with mild diarrhea.  His symptoms have worsened since about April.  He had initially thought symptoms may be secondary to taking a new supplement which contained glucosamine chondroitin and tumeric for joint pain.  He says he stopped this but did not notice any improvement in symptoms.  He had not been on any antibiotics or other new medications prior to onset.  Over the past 4 months he has been having crampy abdominal discomfort worse postprandially and generally at least 4 bowel movements per day.  Appetite has been okay, weight is down about 9 pounds since onset of symptoms.  He has not been aware of any melena or hematochezia. He is started taking Imodium which he is using generally twice daily which does slow his bowel movements some. He had called here in early August and underwent GI path panel which was negative and CBC with differential and c-Met also unremarkable.  He had colonoscopy March 2018 for follow-up of adenomatous colon polyps.  He had a total of 6 polyps removed the largest 9 mm and was noted to have nonbleeding internal hemorrhoids.  Path on the polyps all were consistent with tubular adenomas and he was indicated for 3-year interval follow-up. Other medical problems include BPH and hyperlipidemia.  Review of Systems Pertinent positive and negative review of systems were noted in the above HPI section.  All other review of systems was otherwise negative.   Outpatient Encounter Medications as of 05/14/2021  Medication Sig   gemfibrozil (LOPID) 600 MG tablet TAKE 1 TABLET (600 MG TOTAL) BY MOUTH 2 (TWO) TIMES DAILY BEFORE A MEAL.   Glucosamine-Chondroitin 500-400 MG CAPS  Take by mouth.   ibuprofen (ADVIL) 600 MG tablet Take 1 tablet (600 mg total) by mouth every 6 (six) hours as needed.   Multiple Vitamins-Minerals (CENTRUM SILVER ULTRA MENS PO) Take 1 tablet by mouth daily.     Na Sulfate-K Sulfate-Mg Sulf 17.5-3.13-1.6 GM/177ML SOLN Take 1 kit by mouth once for 1 dose.   [DISCONTINUED] atorvastatin (LIPITOR) 40 MG tablet Take 1 tablet (40 mg total) by mouth daily.   [DISCONTINUED] tiZANidine (ZANAFLEX) 4 MG tablet Take 1 tablet (4 mg total) by mouth every 8 (eight) hours as needed for muscle spasms.   Facility-Administered Encounter Medications as of 05/14/2021  Medication   0.9 %  sodium chloride infusion   No Known Allergies Patient Active Problem List   Diagnosis Date Noted   Hyperlipidemia 02/16/2013   Onychomycosis of toenail 01/16/2011   Tobacco abuse 01/16/2011   Preventative health care 01/16/2011   BPH (benign prostatic hyperplasia) 01/16/2011   Social History   Socioeconomic History   Marital status: Married    Spouse name: cheryl   Number of children: Not on file   Years of education: 12--ged   Highest education level: Not on file  Occupational History   Occupation: Doctor, [employer]  Tobacco Use   Smoking status: Every Day    Packs/day: 1.00    Years: 37.00    Pack years: 37.00    Types: Cigarettes   Smokeless tobacco: Never   Tobacco comments:    pt does not want to quit  Vaping  Use   Vaping Use: Never used  Substance and Sexual Activity   Alcohol use: Yes    Alcohol/week: 6.0 standard drinks    Types: 6 Cans of beer per week   Drug use: No   Sexual activity: Not Currently    Partners: Female    Comment: divorced  Other Topics Concern   Not on file  Social History Narrative   Exercise--  no   Social Determinants of Health   Financial Resource Strain: Not on file  Food Insecurity: Not on file  Transportation Needs: Not on file  Physical Activity: Not on file  Stress: Not on file  Social  Connections: Not on file  Intimate Partner Violence: Not on file    Randy [name]'s family history includes Alcohol abuse in his father, maternal grandfather, and paternal grandfather; Arthritis in his mother; Arthritis/Rheumatoid in his father; Colon cancer (age of onset: 62) in his brother; Diabetes in his maternal aunt; Lung cancer in his mother; Other in his maternal grandfather; Prostate cancer in his brother and father.      Objective:    Vitals:   05/14/21 1504  BP: 118/78  Pulse: 76    Physical Exam  Well-developed well-nourished white male in no acute distress.  Height, Weight, 158 BMI 23.3  HEENT; nontraumatic normocephalic, EOMI, PE R LA, sclera anicteric. Oropharynx; not examined today Neck; supple, no JVD Cardiovascular; regular rate and rhythm with S1-S2, no murmur rub or gallop Pulmonary; Clear bilaterally Abdomen; soft, mild rather generalized tenderness, no guarding nondistended, no palpable mass or hepatosplenomegaly, bowel sounds are active Rectal; not done today Skin; benign exam, no jaundice rash or appreciable lesions Extremities; no clubbing cyanosis or edema skin warm and dry Neuro/Psych; alert and oriented x4, grossly nonfocal mood and affect appropriate        Assessment & Plan:   #75 60 year old white male with several month history of persistent diarrhea and abdominal cramping.  Patient generally having about 4 loose bowel movements per day, nonbloody.  He has had associated 9 pound weight loss. Baseline labs and GI path panel negative as of 04/15/2021  Etiology of symptoms is not clear, infectious work-up has been negative.  Rule out new onset IBD or microscopic colitis, rule out pancreatic insufficiency  #2 history of multiple tubular adenomatous colon polyps, overdue for colonoscopy with last exam March 2018  #3 BPH 4.  Hyperlipidemia  Plan; sed rate, CRP, TTG and IgA Stool for lactoferrin and fecal elastase Patient will be scheduled for  colonoscopy with Dr. Silverio Decamp for follow-up of polyps and further evaluation of diarrhea.  Procedure was discussed in detail with the patient including indications risk and benefits and he is agreeable to proceed. As of today's visit he is appropriate for procedure in the ambulatory care setting.  For now he will continue Imodium once or twice daily. Further recommendations pending results of above.  Alisen Marsiglia S Fouad Taul PA-C 05/14/2021   Cc: No ref. provider found

## 2021-05-15 LAB — IGA: Immunoglobulin A: 201 mg/dL (ref 47–310)

## 2021-05-15 LAB — TISSUE TRANSGLUTAMINASE, IGA: (tTG) Ab, IgA: <1 U/mL

## 2021-05-16 NOTE — Progress Notes (Signed)
Reviewed and agree with documentation and assessment and plan. K. Veena Yaritzy Huser , MD   

## 2021-05-22 ENCOUNTER — Other Ambulatory Visit: Payer: BC Managed Care – PPO

## 2021-05-22 DIAGNOSIS — R197 Diarrhea, unspecified: Secondary | ICD-10-CM | POA: Diagnosis not present

## 2021-05-22 DIAGNOSIS — R634 Abnormal weight loss: Secondary | ICD-10-CM | POA: Diagnosis not present

## 2021-05-24 ENCOUNTER — Other Ambulatory Visit: Payer: Self-pay

## 2021-05-24 ENCOUNTER — Ambulatory Visit (AMBULATORY_SURGERY_CENTER): Payer: BC Managed Care – PPO | Admitting: Gastroenterology

## 2021-05-24 ENCOUNTER — Encounter: Payer: Self-pay | Admitting: Gastroenterology

## 2021-05-24 VITALS — BP 99/75 | HR 68 | Temp 98.0°F | Resp 12 | Ht 69.0 in | Wt 158.0 lb

## 2021-05-24 DIAGNOSIS — Z86010 Personal history of colonic polyps: Secondary | ICD-10-CM

## 2021-05-24 DIAGNOSIS — K648 Other hemorrhoids: Secondary | ICD-10-CM | POA: Diagnosis not present

## 2021-05-24 DIAGNOSIS — R197 Diarrhea, unspecified: Secondary | ICD-10-CM

## 2021-05-24 DIAGNOSIS — K52832 Lymphocytic colitis: Secondary | ICD-10-CM

## 2021-05-24 DIAGNOSIS — R634 Abnormal weight loss: Secondary | ICD-10-CM

## 2021-05-24 MED ORDER — SODIUM CHLORIDE 0.9 % IV SOLN: 500.0000 mL | Freq: Once | INTRAVENOUS | Status: DC

## 2021-05-24 NOTE — Progress Notes (Signed)
Pt's states no medical or surgical changes since previsit or office visit. 

## 2021-05-24 NOTE — Progress Notes (Signed)
Sedate, gd SR, tolerated procedure well, VSS, report to RN 

## 2021-05-24 NOTE — Op Note (Addendum)
[facility] Patient Name: Randy [name] Procedure Date: 05/24/2021 8:42 AM MRN: [id number] Endoscopist: Mauri Pole , MD Age: 60 Referring MD:  Date of Birth: 06/06/1961 Gender: Male Account #: [id number] Procedure:                Colonoscopy Indications:              Clinically significant diarrhea of unexplained                            origin Medicines:                Monitored Anesthesia Care Procedure:                Pre-Anesthesia Assessment:                           - Prior to the procedure, a History and Physical                            was performed, and patient medications and                            allergies were reviewed. The patient's tolerance of                            previous anesthesia was also reviewed. The risks                            and benefits of the procedure and the sedation                            options and risks were discussed with the patient.                            All questions were answered, and informed consent                            was obtained. Prior Anticoagulants: The patient has                            taken no previous anticoagulant or antiplatelet                            agents. ASA Grade Assessment: II - A patient with                            mild systemic disease. After reviewing the risks                            and benefits, the patient was deemed in                            satisfactory condition to undergo the procedure.  After obtaining informed consent, the colonoscope                            was passed under direct vision. Throughout the                            procedure, the patient's blood pressure, pulse, and                            oxygen saturations were monitored continuously. The                            PCF-HQ190L Colonoscope was introduced through the                            anus and advanced to the the cecum, identified by                             appendiceal orifice and ileocecal valve. The                            colonoscopy was performed without difficulty. The                            patient tolerated the procedure well. The quality                            of the bowel preparation was excellent. The                            terminal ileum, ileocecal valve, appendiceal                            orifice, and rectum were photographed. Scope In: 8:50:48 AM Scope Out: 9:04:49 AM Scope Withdrawal Time: 0 hours 9 minutes 6 seconds  Total Procedure Duration: 0 hours 14 minutes 1 second  Findings:                 The perianal and digital rectal examinations were                            normal.                           Normal mucosa was found in the entire colon.                            Biopsies for histology were taken with a cold                            forceps from the right colon and left colon for                            evaluation of microscopic colitis.  Non-bleeding external and internal hemorrhoids were                            found during retroflexion. The hemorrhoids were                            medium-sized. Complications:            No immediate complications. Estimated Blood Loss:     Estimated blood loss was minimal. Impression:               - Normal mucosa in the entire examined colon.                            Biopsied.                           - Non-bleeding external and internal hemorrhoids. Recommendation:           - Patient has a contact number available for                            emergencies. The signs and symptoms of potential                            delayed complications were discussed with the                            patient. Return to normal activities tomorrow.                            Written discharge instructions were provided to the                            patient.                           - Resume previous  diet.                           - Continue present medications.                           - Await pathology results.                           - Repeat colonoscopy in 5 years for surveillance                            given history of colon polyps and family history of                            colon cancer.                           - Return to GI clinic at the next available  appointment in 4-6 weeks. Mauri Pole, MD 05/24/2021 9:19:37 AM This report has been signed electronically.
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
## 2021-05-24 NOTE — Patient Instructions (Signed)
Handout on hemorrhoids given. Await pathology results.  Repeat colonoscopy in 5 years for surveillance.    YOU HAD AN ENDOSCOPIC PROCEDURE TODAY AT [facility]:   Refer to the procedure report that was given to you for any specific questions about what was found during the examination.  If the procedure report does not answer your questions, please call your gastroenterologist to clarify.  If you requested that your care partner not be given the details of your procedure findings, then the procedure report has been included in a sealed envelope for you to review at your convenience later.  YOU SHOULD EXPECT: Some feelings of bloating in the abdomen. Passage of more gas than usual.  Walking can help get rid of the air that was put into your GI tract during the procedure and reduce the bloating. If you had a lower endoscopy (such as a colonoscopy or flexible sigmoidoscopy) you may notice spotting of blood in your stool or on the toilet paper. If you underwent a bowel prep for your procedure, you may not have a normal bowel movement for a few days.  Please Note:  You might notice some irritation and congestion in your nose or some drainage.  This is from the oxygen used during your procedure.  There is no need for concern and it should clear up in a day or so.  SYMPTOMS TO REPORT IMMEDIATELY:  Following lower endoscopy (colonoscopy or flexible sigmoidoscopy):  Excessive amounts of blood in the stool  Significant tenderness or worsening of abdominal pains  Swelling of the abdomen that is new, acute  Fever of 100F or higher   For urgent or emergent issues, a gastroenterologist can be reached at any hour by calling [phone]. Do not use MyChart messaging for urgent concerns.    DIET:  We do recommend a small meal at first, but then you may proceed to your regular diet.  Drink plenty of fluids but you should avoid alcoholic beverages for 24 hours.  ACTIVITY:  You should plan  to take it easy for the rest of today and you should NOT DRIVE or use heavy machinery until tomorrow (because of the sedation medicines used during the test).    FOLLOW UP: Our staff will call the number listed on your records 48-72 hours following your procedure to check on you and address any questions or concerns that you may have regarding the information given to you following your procedure. If we do not reach you, we will leave a message.  We will attempt to reach you two times.  During this call, we will ask if you have developed any symptoms of COVID 19. If you develop any symptoms (ie: fever, flu-like symptoms, shortness of breath, cough etc.) before then, please call [phone].  If you test positive for Covid 19 in the 2 weeks post procedure, please call and report this information to [place].    If any biopsies were taken you will be contacted by phone or by letter within the next 1-3 weeks.  Please call us at [phone] if you have not heard about the biopsies in 3 weeks.    SIGNATURES/CONFIDENTIALITY: You and/or your care partner have signed paperwork which will be entered into your electronic medical record.  These signatures attest to the fact that that the information above on your After Visit Summary has been reviewed and is understood.  Full responsibility of the confidentiality of this discharge information lies with you and/or your care-partner.

## 2021-05-24 NOTE — Progress Notes (Signed)
Called to room to assist during endoscopic procedure.  Patient ID and intended procedure confirmed with present staff. Received instructions for my participation in the procedure from the performing physician.  

## 2021-05-28 ENCOUNTER — Telehealth: Payer: Self-pay | Admitting: *Deleted

## 2021-05-28 NOTE — Telephone Encounter (Signed)
  Follow up Call-  Call back number 05/24/2021  Post procedure Call Back phone  # [phone]  Permission to leave phone message Yes  Some recent data might be hidden     No answer at 2nd attempt follow up phone call.  Left message on voicemail.

## 2021-05-28 NOTE — Telephone Encounter (Signed)
First follow up call attempt.  LVM. 

## 2021-05-29 LAB — FECAL LACTOFERRIN, QUANT
Fecal Lactoferrin: POSITIVE — AB
MICRO NUMBER:: 12341681
SPECIMEN QUALITY:: ADEQUATE

## 2021-05-29 LAB — PANCREATIC ELASTASE, FECAL: Pancreatic Elastase-1, Stool: 475 mcg/g

## 2021-05-30 ENCOUNTER — Telehealth: Payer: Self-pay | Admitting: Gastroenterology

## 2021-06-03 ENCOUNTER — Other Ambulatory Visit: Payer: Self-pay

## 2021-06-03 MED ORDER — BUDESONIDE 3 MG PO CPEP: ORAL_CAPSULE | ORAL | 0 refills | Status: DC

## 2021-06-03 NOTE — Telephone Encounter (Signed)
Budesonide prescription transmitted to the pharmacy. She will take Budesonide 9 mg x 90 day then 6 mg x 14 days then 3 mg x 4 weeks.

## 2021-06-25 ENCOUNTER — Other Ambulatory Visit: Payer: Self-pay | Admitting: Gastroenterology

## 2021-07-31 ENCOUNTER — Other Ambulatory Visit: Payer: Self-pay | Admitting: Gastroenterology

## 2021-08-23 ENCOUNTER — Ambulatory Visit: Payer: BC Managed Care – PPO | Admitting: Gastroenterology

## 2021-08-23 ENCOUNTER — Other Ambulatory Visit: Payer: Self-pay | Admitting: Gastroenterology

## 2021-12-06 DIAGNOSIS — M25511 Pain in right shoulder: Secondary | ICD-10-CM | POA: Diagnosis not present

## 2021-12-06 DIAGNOSIS — M5412 Radiculopathy, cervical region: Secondary | ICD-10-CM | POA: Diagnosis not present

## 2021-12-20 DIAGNOSIS — M25511 Pain in right shoulder: Secondary | ICD-10-CM | POA: Diagnosis not present

## 2022-03-21 DIAGNOSIS — M24811 Other specific joint derangements of right shoulder, not elsewhere classified: Secondary | ICD-10-CM | POA: Diagnosis not present

## 2022-03-21 DIAGNOSIS — M25511 Pain in right shoulder: Secondary | ICD-10-CM | POA: Diagnosis not present

## 2022-08-05 ENCOUNTER — Ambulatory Visit
Admission: EM | Admit: 2022-08-05 | Discharge: 2022-08-05 | Disposition: A | Discharge status: Home / Self Care | Payer: BC Managed Care – PPO | Attending: Physician Assistant | Admitting: Physician Assistant

## 2022-08-05 ENCOUNTER — Encounter: Payer: Self-pay | Admitting: Emergency Medicine

## 2022-08-05 DIAGNOSIS — S39012A Strain of muscle, fascia and tendon of lower back, initial encounter: Secondary | ICD-10-CM

## 2022-08-05 DIAGNOSIS — M545 Low back pain, unspecified: Secondary | ICD-10-CM | POA: Diagnosis not present

## 2022-08-05 DIAGNOSIS — M5459 Other low back pain: Secondary | ICD-10-CM | POA: Diagnosis not present

## 2022-08-05 MED ORDER — CYCLOBENZAPRINE HCL 10 MG PO TABS: 10.0000 mg | ORAL_TABLET | Freq: Three times a day (TID) | ORAL | 0 refills | Status: AC | PRN

## 2022-08-05 NOTE — ED Triage Notes (Signed)
Pt was cleaning and moving things around 2 days ago and injured his back.
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
## 2022-08-05 NOTE — Discharge Instructions (Addendum)

## 2022-11-11 DIAGNOSIS — C44729 Squamous cell carcinoma of skin of left lower limb, including hip: Secondary | ICD-10-CM | POA: Diagnosis not present

## 2022-11-11 DIAGNOSIS — D485 Neoplasm of uncertain behavior of skin: Secondary | ICD-10-CM | POA: Diagnosis not present

## 2022-12-01 DIAGNOSIS — C44729 Squamous cell carcinoma of skin of left lower limb, including hip: Secondary | ICD-10-CM | POA: Diagnosis not present

## 2022-12-01 DIAGNOSIS — L905 Scar conditions and fibrosis of skin: Secondary | ICD-10-CM | POA: Diagnosis not present

## 2022-12-01 DIAGNOSIS — D485 Neoplasm of uncertain behavior of skin: Secondary | ICD-10-CM | POA: Diagnosis not present
